# Patient Record
Sex: Female | Born: 1955
Health system: Southern US, Community
[De-identification: ages and names within clinical notes are randomized; demographics above are authoritative.]

## PROBLEM LIST (undated history)

## (undated) DIAGNOSIS — H469 Unspecified optic neuritis: Secondary | ICD-10-CM

## (undated) DIAGNOSIS — E162 Hypoglycemia, unspecified: Secondary | ICD-10-CM

## (undated) DIAGNOSIS — H409 Unspecified glaucoma: Secondary | ICD-10-CM

## (undated) HISTORY — PX: TONSILLECTOMY: SUR1361

## (undated) HISTORY — PX: ABDOMINAL HYSTERECTOMY: SHX81

## (undated) HISTORY — PX: EYE SURGERY: SHX253

## (undated) HISTORY — DX: Unspecified optic neuritis: H46.9

## (undated) HISTORY — PX: TUBAL LIGATION: SHX77

---

## 1997-08-09 ENCOUNTER — Ambulatory Visit (HOSPITAL_COMMUNITY): Admission: RE | Admit: 1997-08-09 | Discharge: 1997-08-09 | Payer: Self-pay | Admitting: Obstetrics & Gynecology

## 1997-09-24 ENCOUNTER — Encounter: Admission: RE | Admit: 1997-09-24 | Discharge: 1997-09-24 | Payer: Self-pay | Admitting: Obstetrics & Gynecology

## 1997-10-14 ENCOUNTER — Inpatient Hospital Stay (HOSPITAL_COMMUNITY): Admission: AD | Admit: 1997-10-14 | Discharge: 1997-10-14 | Payer: Self-pay | Admitting: Obstetrics & Gynecology

## 2000-05-14 HISTORY — PX: SMALL INTESTINE SURGERY: SHX150

## 2010-05-11 ENCOUNTER — Ambulatory Visit (HOSPITAL_COMMUNITY): Payer: Self-pay | Admitting: Licensed Clinical Social Worker

## 2010-06-01 ENCOUNTER — Ambulatory Visit (HOSPITAL_COMMUNITY)
Admission: RE | Admit: 2010-06-01 | Discharge: 2010-06-01 | Payer: Self-pay | Source: Home / Self Care | Attending: Licensed Clinical Social Worker | Admitting: Licensed Clinical Social Worker

## 2010-06-13 ENCOUNTER — Ambulatory Visit (HOSPITAL_COMMUNITY): Admit: 2010-06-13 | Payer: Self-pay | Admitting: Licensed Clinical Social Worker

## 2010-06-20 ENCOUNTER — Encounter (INDEPENDENT_AMBULATORY_CARE_PROVIDER_SITE_OTHER): Payer: Medicare Other | Admitting: Licensed Clinical Social Worker

## 2010-06-20 DIAGNOSIS — F4321 Adjustment disorder with depressed mood: Secondary | ICD-10-CM

## 2010-06-29 ENCOUNTER — Encounter (HOSPITAL_COMMUNITY): Payer: Self-pay | Admitting: Licensed Clinical Social Worker

## 2010-07-06 ENCOUNTER — Encounter (HOSPITAL_COMMUNITY): Payer: Self-pay | Admitting: Licensed Clinical Social Worker

## 2011-01-17 DIAGNOSIS — M545 Low back pain, unspecified: Secondary | ICD-10-CM | POA: Insufficient documentation

## 2011-07-11 DIAGNOSIS — R109 Unspecified abdominal pain: Secondary | ICD-10-CM | POA: Insufficient documentation

## 2013-07-16 ENCOUNTER — Emergency Department (HOSPITAL_COMMUNITY)
Admission: EM | Admit: 2013-07-16 | Discharge: 2013-07-16 | Disposition: A | Discharge status: Home / Self Care | Payer: Medicare Other | Attending: Emergency Medicine | Admitting: Emergency Medicine

## 2013-07-16 ENCOUNTER — Encounter (HOSPITAL_COMMUNITY): Payer: Self-pay | Admitting: Emergency Medicine

## 2013-07-16 DIAGNOSIS — J3489 Other specified disorders of nose and nasal sinuses: Secondary | ICD-10-CM | POA: Insufficient documentation

## 2013-07-16 DIAGNOSIS — H748X9 Other specified disorders of middle ear and mastoid, unspecified ear: Secondary | ICD-10-CM | POA: Insufficient documentation

## 2013-07-16 DIAGNOSIS — K0889 Other specified disorders of teeth and supporting structures: Secondary | ICD-10-CM

## 2013-07-16 DIAGNOSIS — E162 Hypoglycemia, unspecified: Secondary | ICD-10-CM | POA: Insufficient documentation

## 2013-07-16 DIAGNOSIS — R6883 Chills (without fever): Secondary | ICD-10-CM | POA: Insufficient documentation

## 2013-07-16 DIAGNOSIS — L539 Erythematous condition, unspecified: Secondary | ICD-10-CM | POA: Insufficient documentation

## 2013-07-16 DIAGNOSIS — K089 Disorder of teeth and supporting structures, unspecified: Secondary | ICD-10-CM | POA: Insufficient documentation

## 2013-07-16 DIAGNOSIS — H409 Unspecified glaucoma: Secondary | ICD-10-CM | POA: Insufficient documentation

## 2013-07-16 HISTORY — DX: Hypoglycemia, unspecified: E16.2

## 2013-07-16 HISTORY — DX: Unspecified glaucoma: H40.9

## 2013-07-16 MED ORDER — PENICILLIN V POTASSIUM 500 MG PO TABS: 500.0000 mg | ORAL_TABLET | Freq: Four times a day (QID) | ORAL | Status: AC

## 2013-07-16 MED ORDER — HYDROCODONE-ACETAMINOPHEN 5-325 MG PO TABS: 1.0000 | ORAL_TABLET | ORAL | Status: DC | PRN

## 2013-07-16 MED ORDER — HYDROCODONE-ACETAMINOPHEN 5-325 MG PO TABS
1.0000 | ORAL_TABLET | Freq: Once | ORAL | Status: AC
  Administered 2013-07-16: 1 via ORAL
  Filled 2013-07-16: qty 1

## 2013-07-16 NOTE — ED Provider Notes (Signed)
CSN: [id number]     Arrival date & time 07/16/13  1017 History   This chart was scribed for non-physician practitioner Irish EldersKelly Lailee Hoelzel, NP, working with Ward GivensIva L Knapp, MD, by Yevette EdwardsAngela Bracken, ED Scribe. This patient was seen in room WTR7/WTR7 and the patient's care was started at 12:19 PM.  First MD Initiated Contact with Patient 07/16/13 1150     Chief Complaint  Patient presents with  . Dental Pain    The history is provided by the patient. No language interpreter was used.   HPI Comments: Jessica [name] is a 58 y.o. female who presents to the Emergency Department complaining of right-sided, lower dental pain which began two weeks ago and has gradually worsened. She reports two weeks ago the tooth pain was also associated with swelling. She rate the pain as 5/10. The pain is increased with mastication, and the tooth is also sensitive to temperatures. She has experienced chills and congestion. She denies a fever. She had a crown to the affected tooth. The pt was treated for the dental pain by her PCP; she was provided amoxicillin and IBU and Tylenol pain medication as well as Voltaren gel. She does not have dental insurance.   The pt has used Vicodin previously, though she requests a weaker dose.  Past Medical History  Diagnosis Date  . Glaucoma    Past Surgical History  Procedure Laterality Date  . Eye surgery    . Abdominal hysterectomy    . Tonsillectomy     No family history on file. History  Substance Use Topics  . Smoking status: Never Smoker   . Smokeless tobacco: Not on file  . Alcohol Use: No   No OB history provided.  Review of Systems  Constitutional: Positive for chills. Negative for fever.  HENT: Positive for congestion and dental problem.   All other systems reviewed and are negative.    Allergies  Motrin; Sulfa antibiotics; Beta adrenergic blockers; Macrobid; Ultram; Wheat bran; and Sugar-protein-starch  Home Medications   Current Outpatient Rx  Name  Route  Sig   Dispense  Refill  . amoxicillin (AMOXIL) 500 MG capsule   Oral   Take 1 capsule by mouth 3 (three) times daily.         Marland Kitchen. ibuprofen (ADVIL,MOTRIN) 400 MG tablet   Oral   Take 1 tablet by mouth 3 (three) times daily as needed.         . latanoprost (XALATAN) 0.005 % ophthalmic solution   Right Eye   Place 1 drop into the right eye.         . VOLTAREN 1 % GEL   Topical   Apply 2 g topically daily as needed.          Triage Vitals: BP 113/62  Pulse 58  Temp(Src) 98.2 F (36.8 C) (Oral)  Resp 16  SpO2 100%  Physical Exam  Nursing note and vitals reviewed. Constitutional: She is oriented to person, place, and time. She appears well-developed and well-nourished. No distress.  HENT:  Head: Normocephalic and atraumatic.  Right Ear: Tympanic membrane is not erythematous and not retracted. A middle ear effusion is present.  Left Ear: Tympanic membrane normal.  Mouth/Throat:    Eyes: EOM are normal.  Neck: Neck supple. No tracheal deviation present.  Cardiovascular: Normal rate, regular rhythm and normal heart sounds.  Exam reveals no friction rub.   No murmur heard. Pulmonary/Chest: Effort normal and breath sounds normal. No respiratory distress. She has no wheezes. She  has no rales.  Musculoskeletal: Normal range of motion.  Neurological: She is alert and oriented to person, place, and time.  Skin: Skin is warm and dry.  Psychiatric: She has a normal mood and affect. Her behavior is normal.    ED Course  Procedures (including critical care time)  DIAGNOSTIC STUDIES: Oxygen Saturation is 100% on room air, normal by my interpretation.    COORDINATION OF CARE:  12:26 PM- Discussed treatment plan with patient, which includes pain medication and antibiotic, and the patient agreed to the plan. Advised the pt that she needed to follow-up with a dentist and informed pt she would be provided with a list of dental references.   Labs Review Labs Reviewed - No data to  display Imaging Review No results found.   EKG Interpretation None      MDM   Final diagnoses:  Pain, dental   Dental pain with probable abscess. Afebrile. No difficulty swallowing. Mild swelling along jaw line and painful to chew. Hydrocodone given here with relief. Pen VK prescribed with few hydrocodone. Advised follow-up with dentist. Low cost dental information given to pt.   I personally performed the services described in this documentation, which was scribed in my presence. The recorded information has been reviewed and is accurate.    Irish Elders, NP 07/23/13 1422

## 2013-07-16 NOTE — Discharge Instructions (Signed)
Dental Pain A tooth ache may be caused by cavities (tooth decay). Cavities expose the nerve of the tooth to air and hot or cold temperatures. It may come from an infection or abscess (also called a boil or furuncle) around your tooth. It is also often caused by dental caries (tooth decay). This causes the pain you are having. DIAGNOSIS  Your caregiver can diagnose this problem by exam. TREATMENT   If caused by an infection, it may be treated with medications which kill germs (antibiotics) and pain medications as prescribed by your caregiver. Take medications as directed.  Only take over-the-counter or prescription medicines for pain, discomfort, or fever as directed by your caregiver.  Whether the tooth ache today is caused by infection or dental disease, you should see your dentist as soon as possible for further care. SEEK MEDICAL CARE IF: The exam and treatment you received today has been provided on an emergency basis only. This is not a substitute for complete medical or dental care. If your problem worsens or new problems (symptoms) appear, and you are unable to meet with your dentist, call or return to this location. SEEK IMMEDIATE MEDICAL CARE IF:   You have a fever.  You develop redness and swelling of your face, jaw, or neck.  You are unable to open your mouth.  You have severe pain uncontrolled by pain medicine. MAKE SURE YOU:   Understand these instructions.  Will watch your condition.  Will get help right away if you are not doing well or get worse. Document Released: 04/30/2005 Document Revised: 07/23/2011 Document Reviewed: 12/17/2007 [facility] Patient Information 2014 [employer] [place].   Take antibiotic prescription as directed Norco for mod-severe pain Follow-up with dentist

## 2013-07-16 NOTE — ED Notes (Signed)
Per pt, states dental pain for 2 weeks-bottom right tooth-was given antibiotic and pain meds from PCP-pain is coming back-

## 2013-07-17 ENCOUNTER — Telehealth (HOSPITAL_COMMUNITY): Payer: Self-pay

## 2013-07-17 NOTE — ED Notes (Signed)
Call from on call dentist Dr Cain SieveJohn Knox requesting demographics and referral be faxed to their office.

## 2013-07-23 NOTE — ED Provider Notes (Signed)
Medical screening examination/treatment/procedure(s) were performed by non-physician practitioner and as supervising physician I was immediately available for consultation/collaboration.   EKG Interpretation None      Lorenza Winkleman, MD, FACEP   Amaurie Wandel L Nasif Bos, MD 07/23/13 1456 

## 2013-12-12 ENCOUNTER — Encounter (HOSPITAL_COMMUNITY): Payer: Self-pay | Admitting: Emergency Medicine

## 2013-12-12 ENCOUNTER — Emergency Department (HOSPITAL_COMMUNITY)
Admission: EM | Admit: 2013-12-12 | Discharge: 2013-12-12 | Disposition: A | Discharge status: Home / Self Care | Payer: No Typology Code available for payment source | Attending: Emergency Medicine | Admitting: Emergency Medicine

## 2013-12-12 ENCOUNTER — Emergency Department (HOSPITAL_COMMUNITY): Payer: No Typology Code available for payment source

## 2013-12-12 DIAGNOSIS — Z862 Personal history of diseases of the blood and blood-forming organs and certain disorders involving the immune mechanism: Secondary | ICD-10-CM | POA: Diagnosis not present

## 2013-12-12 DIAGNOSIS — Y92410 Unspecified street and highway as the place of occurrence of the external cause: Secondary | ICD-10-CM | POA: Insufficient documentation

## 2013-12-12 DIAGNOSIS — Z8639 Personal history of other endocrine, nutritional and metabolic disease: Secondary | ICD-10-CM | POA: Insufficient documentation

## 2013-12-12 DIAGNOSIS — S8010XA Contusion of unspecified lower leg, initial encounter: Secondary | ICD-10-CM | POA: Insufficient documentation

## 2013-12-12 DIAGNOSIS — S5000XA Contusion of unspecified elbow, initial encounter: Secondary | ICD-10-CM | POA: Insufficient documentation

## 2013-12-12 DIAGNOSIS — IMO0002 Reserved for concepts with insufficient information to code with codable children: Secondary | ICD-10-CM | POA: Insufficient documentation

## 2013-12-12 DIAGNOSIS — S5001XA Contusion of right elbow, initial encounter: Secondary | ICD-10-CM

## 2013-12-12 DIAGNOSIS — S161XXA Strain of muscle, fascia and tendon at neck level, initial encounter: Secondary | ICD-10-CM

## 2013-12-12 DIAGNOSIS — Y9389 Activity, other specified: Secondary | ICD-10-CM | POA: Insufficient documentation

## 2013-12-12 DIAGNOSIS — Z79899 Other long term (current) drug therapy: Secondary | ICD-10-CM | POA: Insufficient documentation

## 2013-12-12 DIAGNOSIS — S8011XA Contusion of right lower leg, initial encounter: Secondary | ICD-10-CM

## 2013-12-12 DIAGNOSIS — Z791 Long term (current) use of non-steroidal anti-inflammatories (NSAID): Secondary | ICD-10-CM | POA: Insufficient documentation

## 2013-12-12 DIAGNOSIS — Z792 Long term (current) use of antibiotics: Secondary | ICD-10-CM | POA: Diagnosis not present

## 2013-12-12 DIAGNOSIS — Z043 Encounter for examination and observation following other accident: Secondary | ICD-10-CM | POA: Insufficient documentation

## 2013-12-12 DIAGNOSIS — S139XXA Sprain of joints and ligaments of unspecified parts of neck, initial encounter: Secondary | ICD-10-CM | POA: Diagnosis not present

## 2013-12-12 MED ORDER — CYCLOBENZAPRINE HCL 10 MG PO TABS: 10.0000 mg | ORAL_TABLET | Freq: Every day | ORAL | Status: DC

## 2013-12-12 MED ORDER — MORPHINE SULFATE 4 MG/ML IJ SOLN
4.0000 mg | Freq: Once | INTRAMUSCULAR | Status: AC
  Administered 2013-12-12: 4 mg via INTRAVENOUS
  Filled 2013-12-12: qty 1

## 2013-12-12 MED ORDER — NAPROXEN 250 MG PO TABS
500.0000 mg | ORAL_TABLET | Freq: Once | ORAL | Status: AC
  Administered 2013-12-12: 500 mg via ORAL
  Filled 2013-12-12: qty 2

## 2013-12-12 MED ORDER — NAPROXEN 500 MG PO TABS: 500.0000 mg | ORAL_TABLET | Freq: Two times a day (BID) | ORAL | Status: DC

## 2013-12-12 MED ORDER — HYDROCODONE-ACETAMINOPHEN 5-325 MG PO TABS: 1.0000 | ORAL_TABLET | ORAL | Status: DC | PRN
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
## 2013-12-12 NOTE — Discharge Instructions (Signed)
Call for a follow up appointment with a Family or Primary Care Provider.  Return if Symptoms worsen.   Take medication as prescribed.  Do not operate heavy machinery while taking flexeril or narcotic pain medication.  Ice your neck, elbow, and leg 3-4 times a day.

## 2013-12-12 NOTE — ED Notes (Signed)
Patient transported to X-ray 

## 2013-12-12 NOTE — ED Notes (Signed)
Patient returned from X-ray 

## 2013-12-17 NOTE — ED Provider Notes (Signed)
Medical screening examination/treatment/procedure(s) were performed by non-physician practitioner and as supervising physician I was immediately available for consultation/collaboration.   EKG Interpretation None        Roseanna Koplin, MD 12/17/13 0728 

## 2013-12-31 ENCOUNTER — Emergency Department (HOSPITAL_COMMUNITY)
Admission: EM | Admit: 2013-12-31 | Discharge: 2014-01-01 | Disposition: A | Discharge status: Home / Self Care | Payer: No Typology Code available for payment source | Attending: Emergency Medicine | Admitting: Emergency Medicine

## 2013-12-31 DIAGNOSIS — G8911 Acute pain due to trauma: Secondary | ICD-10-CM | POA: Diagnosis not present

## 2013-12-31 DIAGNOSIS — Z8639 Personal history of other endocrine, nutritional and metabolic disease: Secondary | ICD-10-CM | POA: Insufficient documentation

## 2013-12-31 DIAGNOSIS — M79609 Pain in unspecified limb: Secondary | ICD-10-CM | POA: Diagnosis not present

## 2013-12-31 DIAGNOSIS — S46819A Strain of other muscles, fascia and tendons at shoulder and upper arm level, unspecified arm, initial encounter: Principal | ICD-10-CM

## 2013-12-31 DIAGNOSIS — S43499A Other sprain of unspecified shoulder joint, initial encounter: Secondary | ICD-10-CM | POA: Insufficient documentation

## 2013-12-31 DIAGNOSIS — Z791 Long term (current) use of non-steroidal anti-inflammatories (NSAID): Secondary | ICD-10-CM | POA: Diagnosis not present

## 2013-12-31 DIAGNOSIS — Y9389 Activity, other specified: Secondary | ICD-10-CM | POA: Insufficient documentation

## 2013-12-31 DIAGNOSIS — Y9289 Other specified places as the place of occurrence of the external cause: Secondary | ICD-10-CM | POA: Insufficient documentation

## 2013-12-31 DIAGNOSIS — Z79899 Other long term (current) drug therapy: Secondary | ICD-10-CM | POA: Diagnosis not present

## 2013-12-31 DIAGNOSIS — H409 Unspecified glaucoma: Secondary | ICD-10-CM | POA: Diagnosis not present

## 2013-12-31 DIAGNOSIS — R51 Headache: Secondary | ICD-10-CM | POA: Insufficient documentation

## 2013-12-31 DIAGNOSIS — Z862 Personal history of diseases of the blood and blood-forming organs and certain disorders involving the immune mechanism: Secondary | ICD-10-CM | POA: Diagnosis not present

## 2013-12-31 DIAGNOSIS — M542 Cervicalgia: Secondary | ICD-10-CM | POA: Insufficient documentation

## 2013-12-31 DIAGNOSIS — S46909A Unspecified injury of unspecified muscle, fascia and tendon at shoulder and upper arm level, unspecified arm, initial encounter: Secondary | ICD-10-CM | POA: Insufficient documentation

## 2013-12-31 DIAGNOSIS — X500XXA Overexertion from strenuous movement or load, initial encounter: Secondary | ICD-10-CM | POA: Diagnosis not present

## 2013-12-31 DIAGNOSIS — S4980XA Other specified injuries of shoulder and upper arm, unspecified arm, initial encounter: Secondary | ICD-10-CM | POA: Diagnosis present

## 2013-12-31 DIAGNOSIS — H539 Unspecified visual disturbance: Secondary | ICD-10-CM | POA: Insufficient documentation

## 2013-12-31 DIAGNOSIS — S46811A Strain of other muscles, fascia and tendons at shoulder and upper arm level, right arm, initial encounter: Secondary | ICD-10-CM
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
## 2014-01-01 DIAGNOSIS — S43499A Other sprain of unspecified shoulder joint, initial encounter: Secondary | ICD-10-CM | POA: Diagnosis not present

## 2014-01-01 MED ORDER — NAPROXEN 375 MG PO TABS: 375.0000 mg | ORAL_TABLET | Freq: Two times a day (BID) | ORAL | Status: DC

## 2014-01-01 MED ORDER — NAPROXEN 250 MG PO TABS
375.0000 mg | ORAL_TABLET | Freq: Once | ORAL | Status: AC
  Administered 2014-01-01: 375 mg via ORAL
  Filled 2014-01-01: qty 2

## 2014-01-01 NOTE — ED Notes (Signed)
[facility]  Was seen here at that time.  C/O continued neck pain and pain that goes across the back of her head.

## 2014-01-01 NOTE — Discharge Instructions (Signed)
If you were given medicines take as directed.  If you are on coumadin or contraceptives realize their levels and effectiveness is altered by many different medicines.  If you have any reaction (rash, tongues swelling, other) to the medicines stop taking and see a physician.   Continue heat as needed, stretching and physical therapy.  Please follow up as directed and return to the ER or see a physician for new or worsening symptoms.  Thank you. Filed Vitals:   12/31/13 2000 01/01/14 0015 01/01/14 0030  BP: 107/71 111/82 106/90  Pulse: 89 56 59  Temp: 98 F (36.7 C)    TempSrc: Oral    Resp: 17 20 16   Height: 5\' 3"  (1.6 m)    Weight: 143 lb (64.864 kg)    SpO2: 99% 99% 100%

## 2014-01-01 NOTE — ED Notes (Signed)
MD at bedside. 
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
## 2014-02-25 ENCOUNTER — Other Ambulatory Visit: Payer: Self-pay | Admitting: Family Medicine

## 2014-02-25 DIAGNOSIS — R1013 Epigastric pain: Secondary | ICD-10-CM

## 2014-03-05 ENCOUNTER — Ambulatory Visit
Admission: RE | Admit: 2014-03-05 | Discharge: 2014-03-05 | Disposition: A | Discharge status: Home / Self Care | Payer: Medicare HMO | Source: Ambulatory Visit | Attending: Family Medicine | Admitting: Family Medicine

## 2014-03-05 DIAGNOSIS — R1013 Epigastric pain: Secondary | ICD-10-CM

## 2014-08-18 ENCOUNTER — Other Ambulatory Visit: Payer: Self-pay | Admitting: Family Medicine

## 2014-08-18 DIAGNOSIS — R1013 Epigastric pain: Secondary | ICD-10-CM

## 2014-08-23 ENCOUNTER — Ambulatory Visit
Admission: RE | Admit: 2014-08-23 | Discharge: 2014-08-23 | Disposition: A | Discharge status: Home / Self Care | Payer: Medicare HMO | Source: Ambulatory Visit | Attending: Family Medicine | Admitting: Family Medicine

## 2014-08-23 DIAGNOSIS — R1013 Epigastric pain: Secondary | ICD-10-CM

## 2014-10-28 DIAGNOSIS — H5010 Unspecified exotropia: Secondary | ICD-10-CM | POA: Insufficient documentation

## 2014-10-28 DIAGNOSIS — H1820 Unspecified corneal edema: Secondary | ICD-10-CM | POA: Insufficient documentation

## 2015-02-18 DIAGNOSIS — M62830 Muscle spasm of back: Secondary | ICD-10-CM | POA: Diagnosis not present

## 2015-02-18 DIAGNOSIS — M9901 Segmental and somatic dysfunction of cervical region: Secondary | ICD-10-CM | POA: Diagnosis not present

## 2015-02-18 DIAGNOSIS — M9903 Segmental and somatic dysfunction of lumbar region: Secondary | ICD-10-CM | POA: Diagnosis not present

## 2015-02-21 DIAGNOSIS — M62830 Muscle spasm of back: Secondary | ICD-10-CM | POA: Diagnosis not present

## 2015-02-21 DIAGNOSIS — M9901 Segmental and somatic dysfunction of cervical region: Secondary | ICD-10-CM | POA: Diagnosis not present

## 2015-02-21 DIAGNOSIS — M9903 Segmental and somatic dysfunction of lumbar region: Secondary | ICD-10-CM | POA: Diagnosis not present

## 2015-02-25 DIAGNOSIS — M9903 Segmental and somatic dysfunction of lumbar region: Secondary | ICD-10-CM | POA: Diagnosis not present

## 2015-02-25 DIAGNOSIS — M9901 Segmental and somatic dysfunction of cervical region: Secondary | ICD-10-CM | POA: Diagnosis not present

## 2015-02-25 DIAGNOSIS — M62830 Muscle spasm of back: Secondary | ICD-10-CM | POA: Diagnosis not present

## 2015-02-28 DIAGNOSIS — M9901 Segmental and somatic dysfunction of cervical region: Secondary | ICD-10-CM | POA: Diagnosis not present

## 2015-02-28 DIAGNOSIS — M62830 Muscle spasm of back: Secondary | ICD-10-CM | POA: Diagnosis not present

## 2015-02-28 DIAGNOSIS — M9903 Segmental and somatic dysfunction of lumbar region: Secondary | ICD-10-CM | POA: Diagnosis not present

## 2015-03-15 DIAGNOSIS — N952 Postmenopausal atrophic vaginitis: Secondary | ICD-10-CM | POA: Diagnosis not present

## 2015-03-15 DIAGNOSIS — Z0000 Encounter for general adult medical examination without abnormal findings: Secondary | ICD-10-CM | POA: Diagnosis not present

## 2015-03-15 DIAGNOSIS — B373 Candidiasis of vulva and vagina: Secondary | ICD-10-CM | POA: Diagnosis not present

## 2015-03-15 DIAGNOSIS — J309 Allergic rhinitis, unspecified: Secondary | ICD-10-CM | POA: Diagnosis not present

## 2015-03-15 DIAGNOSIS — Z1211 Encounter for screening for malignant neoplasm of colon: Secondary | ICD-10-CM | POA: Diagnosis not present

## 2015-03-15 DIAGNOSIS — Z136 Encounter for screening for cardiovascular disorders: Secondary | ICD-10-CM | POA: Diagnosis not present

## 2015-03-15 DIAGNOSIS — Z01419 Encounter for gynecological examination (general) (routine) without abnormal findings: Secondary | ICD-10-CM | POA: Diagnosis not present

## 2015-03-28 DIAGNOSIS — M9903 Segmental and somatic dysfunction of lumbar region: Secondary | ICD-10-CM | POA: Diagnosis not present

## 2015-03-28 DIAGNOSIS — M9901 Segmental and somatic dysfunction of cervical region: Secondary | ICD-10-CM | POA: Diagnosis not present

## 2015-03-28 DIAGNOSIS — M62830 Muscle spasm of back: Secondary | ICD-10-CM | POA: Diagnosis not present

## 2015-03-30 DIAGNOSIS — M9901 Segmental and somatic dysfunction of cervical region: Secondary | ICD-10-CM | POA: Diagnosis not present

## 2015-03-30 DIAGNOSIS — M62830 Muscle spasm of back: Secondary | ICD-10-CM | POA: Diagnosis not present

## 2015-03-30 DIAGNOSIS — M9903 Segmental and somatic dysfunction of lumbar region: Secondary | ICD-10-CM | POA: Diagnosis not present

## 2015-04-04 DIAGNOSIS — M9903 Segmental and somatic dysfunction of lumbar region: Secondary | ICD-10-CM | POA: Diagnosis not present

## 2015-04-04 DIAGNOSIS — M62830 Muscle spasm of back: Secondary | ICD-10-CM | POA: Diagnosis not present

## 2015-04-04 DIAGNOSIS — M9901 Segmental and somatic dysfunction of cervical region: Secondary | ICD-10-CM | POA: Diagnosis not present

## 2015-04-05 DIAGNOSIS — Z1231 Encounter for screening mammogram for malignant neoplasm of breast: Secondary | ICD-10-CM | POA: Diagnosis not present

## 2015-04-05 DIAGNOSIS — M9901 Segmental and somatic dysfunction of cervical region: Secondary | ICD-10-CM | POA: Diagnosis not present

## 2015-04-05 DIAGNOSIS — M9903 Segmental and somatic dysfunction of lumbar region: Secondary | ICD-10-CM | POA: Diagnosis not present

## 2015-04-05 DIAGNOSIS — M62830 Muscle spasm of back: Secondary | ICD-10-CM | POA: Diagnosis not present

## 2015-04-11 DIAGNOSIS — M62830 Muscle spasm of back: Secondary | ICD-10-CM | POA: Diagnosis not present

## 2015-04-11 DIAGNOSIS — M9901 Segmental and somatic dysfunction of cervical region: Secondary | ICD-10-CM | POA: Diagnosis not present

## 2015-04-11 DIAGNOSIS — M9903 Segmental and somatic dysfunction of lumbar region: Secondary | ICD-10-CM | POA: Diagnosis not present

## 2015-05-27 DIAGNOSIS — M9903 Segmental and somatic dysfunction of lumbar region: Secondary | ICD-10-CM | POA: Diagnosis not present

## 2015-05-27 DIAGNOSIS — M9901 Segmental and somatic dysfunction of cervical region: Secondary | ICD-10-CM | POA: Diagnosis not present

## 2015-05-27 DIAGNOSIS — M62830 Muscle spasm of back: Secondary | ICD-10-CM | POA: Diagnosis not present

## 2015-05-30 DIAGNOSIS — M62830 Muscle spasm of back: Secondary | ICD-10-CM | POA: Diagnosis not present

## 2015-05-30 DIAGNOSIS — M9901 Segmental and somatic dysfunction of cervical region: Secondary | ICD-10-CM | POA: Diagnosis not present

## 2015-05-30 DIAGNOSIS — M9903 Segmental and somatic dysfunction of lumbar region: Secondary | ICD-10-CM | POA: Diagnosis not present

## 2015-06-02 DIAGNOSIS — M9903 Segmental and somatic dysfunction of lumbar region: Secondary | ICD-10-CM | POA: Diagnosis not present

## 2015-06-02 DIAGNOSIS — M9901 Segmental and somatic dysfunction of cervical region: Secondary | ICD-10-CM | POA: Diagnosis not present

## 2015-06-02 DIAGNOSIS — M62830 Muscle spasm of back: Secondary | ICD-10-CM | POA: Diagnosis not present

## 2015-06-06 DIAGNOSIS — M9901 Segmental and somatic dysfunction of cervical region: Secondary | ICD-10-CM | POA: Diagnosis not present

## 2015-06-06 DIAGNOSIS — M9903 Segmental and somatic dysfunction of lumbar region: Secondary | ICD-10-CM | POA: Diagnosis not present

## 2015-06-06 DIAGNOSIS — M62830 Muscle spasm of back: Secondary | ICD-10-CM | POA: Diagnosis not present

## 2015-06-15 DIAGNOSIS — M9903 Segmental and somatic dysfunction of lumbar region: Secondary | ICD-10-CM | POA: Diagnosis not present

## 2015-06-15 DIAGNOSIS — M62830 Muscle spasm of back: Secondary | ICD-10-CM | POA: Diagnosis not present

## 2015-06-15 DIAGNOSIS — M9901 Segmental and somatic dysfunction of cervical region: Secondary | ICD-10-CM | POA: Diagnosis not present

## 2015-06-17 DIAGNOSIS — M9903 Segmental and somatic dysfunction of lumbar region: Secondary | ICD-10-CM | POA: Diagnosis not present

## 2015-06-17 DIAGNOSIS — M62830 Muscle spasm of back: Secondary | ICD-10-CM | POA: Diagnosis not present

## 2015-06-17 DIAGNOSIS — M9901 Segmental and somatic dysfunction of cervical region: Secondary | ICD-10-CM | POA: Diagnosis not present

## 2015-06-20 DIAGNOSIS — M9901 Segmental and somatic dysfunction of cervical region: Secondary | ICD-10-CM | POA: Diagnosis not present

## 2015-06-20 DIAGNOSIS — M62830 Muscle spasm of back: Secondary | ICD-10-CM | POA: Diagnosis not present

## 2015-06-20 DIAGNOSIS — M9903 Segmental and somatic dysfunction of lumbar region: Secondary | ICD-10-CM | POA: Diagnosis not present

## 2015-06-21 DIAGNOSIS — E559 Vitamin D deficiency, unspecified: Secondary | ICD-10-CM | POA: Diagnosis not present

## 2015-06-23 DIAGNOSIS — K219 Gastro-esophageal reflux disease without esophagitis: Secondary | ICD-10-CM | POA: Diagnosis not present

## 2015-06-23 DIAGNOSIS — R1013 Epigastric pain: Secondary | ICD-10-CM | POA: Diagnosis not present

## 2015-06-23 DIAGNOSIS — E559 Vitamin D deficiency, unspecified: Secondary | ICD-10-CM | POA: Diagnosis not present

## 2015-06-23 DIAGNOSIS — R300 Dysuria: Secondary | ICD-10-CM | POA: Diagnosis not present

## 2015-08-10 DIAGNOSIS — K5900 Constipation, unspecified: Secondary | ICD-10-CM | POA: Diagnosis not present

## 2015-08-10 DIAGNOSIS — K219 Gastro-esophageal reflux disease without esophagitis: Secondary | ICD-10-CM | POA: Diagnosis not present

## 2015-09-27 DIAGNOSIS — S0086XA Insect bite (nonvenomous) of other part of head, initial encounter: Secondary | ICD-10-CM | POA: Diagnosis not present

## 2015-09-27 DIAGNOSIS — S30860A Insect bite (nonvenomous) of lower back and pelvis, initial encounter: Secondary | ICD-10-CM | POA: Diagnosis not present

## 2015-09-27 DIAGNOSIS — G629 Polyneuropathy, unspecified: Secondary | ICD-10-CM | POA: Diagnosis not present

## 2015-09-27 DIAGNOSIS — S1096XA Insect bite of unspecified part of neck, initial encounter: Secondary | ICD-10-CM | POA: Diagnosis not present

## 2015-09-27 DIAGNOSIS — Z885 Allergy status to narcotic agent status: Secondary | ICD-10-CM | POA: Diagnosis not present

## 2015-09-27 DIAGNOSIS — Z888 Allergy status to other drugs, medicaments and biological substances status: Secondary | ICD-10-CM | POA: Diagnosis not present

## 2015-09-27 DIAGNOSIS — S50862A Insect bite (nonvenomous) of left forearm, initial encounter: Secondary | ICD-10-CM | POA: Diagnosis not present

## 2015-09-27 DIAGNOSIS — Z886 Allergy status to analgesic agent status: Secondary | ICD-10-CM | POA: Diagnosis not present

## 2015-09-27 DIAGNOSIS — Z882 Allergy status to sulfonamides status: Secondary | ICD-10-CM | POA: Diagnosis not present

## 2015-09-27 DIAGNOSIS — W57XXXA Bitten or stung by nonvenomous insect and other nonvenomous arthropods, initial encounter: Secondary | ICD-10-CM | POA: Diagnosis not present

## 2015-10-20 DIAGNOSIS — H9203 Otalgia, bilateral: Secondary | ICD-10-CM | POA: Diagnosis not present

## 2015-10-20 DIAGNOSIS — J309 Allergic rhinitis, unspecified: Secondary | ICD-10-CM | POA: Diagnosis not present

## 2015-10-20 DIAGNOSIS — H6123 Impacted cerumen, bilateral: Secondary | ICD-10-CM | POA: Diagnosis not present

## 2015-10-21 DIAGNOSIS — H401114 Primary open-angle glaucoma, right eye, indeterminate stage: Secondary | ICD-10-CM | POA: Diagnosis not present

## 2015-10-21 DIAGNOSIS — H1820 Unspecified corneal edema: Secondary | ICD-10-CM | POA: Diagnosis not present

## 2015-10-21 DIAGNOSIS — H43811 Vitreous degeneration, right eye: Secondary | ICD-10-CM | POA: Diagnosis not present

## 2015-10-21 DIAGNOSIS — H5010 Unspecified exotropia: Secondary | ICD-10-CM | POA: Diagnosis not present

## 2015-12-02 DIAGNOSIS — W228XXA Striking against or struck by other objects, initial encounter: Secondary | ICD-10-CM | POA: Diagnosis not present

## 2015-12-02 DIAGNOSIS — T148 Other injury of unspecified body region: Secondary | ICD-10-CM | POA: Diagnosis not present

## 2015-12-07 DIAGNOSIS — W228XXA Striking against or struck by other objects, initial encounter: Secondary | ICD-10-CM | POA: Diagnosis not present

## 2015-12-07 DIAGNOSIS — S9032XA Contusion of left foot, initial encounter: Secondary | ICD-10-CM | POA: Diagnosis not present

## 2015-12-29 DIAGNOSIS — M62830 Muscle spasm of back: Secondary | ICD-10-CM | POA: Diagnosis not present

## 2015-12-29 DIAGNOSIS — M9901 Segmental and somatic dysfunction of cervical region: Secondary | ICD-10-CM | POA: Diagnosis not present

## 2015-12-29 DIAGNOSIS — M9903 Segmental and somatic dysfunction of lumbar region: Secondary | ICD-10-CM | POA: Diagnosis not present

## 2016-01-04 DIAGNOSIS — M9903 Segmental and somatic dysfunction of lumbar region: Secondary | ICD-10-CM | POA: Diagnosis not present

## 2016-01-04 DIAGNOSIS — M9901 Segmental and somatic dysfunction of cervical region: Secondary | ICD-10-CM | POA: Diagnosis not present

## 2016-01-04 DIAGNOSIS — M62830 Muscle spasm of back: Secondary | ICD-10-CM | POA: Diagnosis not present

## 2016-01-06 DIAGNOSIS — M9901 Segmental and somatic dysfunction of cervical region: Secondary | ICD-10-CM | POA: Diagnosis not present

## 2016-01-06 DIAGNOSIS — M62830 Muscle spasm of back: Secondary | ICD-10-CM | POA: Diagnosis not present

## 2016-01-06 DIAGNOSIS — M9903 Segmental and somatic dysfunction of lumbar region: Secondary | ICD-10-CM | POA: Diagnosis not present

## 2016-01-11 DIAGNOSIS — M9903 Segmental and somatic dysfunction of lumbar region: Secondary | ICD-10-CM | POA: Diagnosis not present

## 2016-01-11 DIAGNOSIS — M9901 Segmental and somatic dysfunction of cervical region: Secondary | ICD-10-CM | POA: Diagnosis not present

## 2016-01-11 DIAGNOSIS — M62830 Muscle spasm of back: Secondary | ICD-10-CM | POA: Diagnosis not present

## 2016-01-13 DIAGNOSIS — M9903 Segmental and somatic dysfunction of lumbar region: Secondary | ICD-10-CM | POA: Diagnosis not present

## 2016-01-13 DIAGNOSIS — M9901 Segmental and somatic dysfunction of cervical region: Secondary | ICD-10-CM | POA: Diagnosis not present

## 2016-01-13 DIAGNOSIS — M62830 Muscle spasm of back: Secondary | ICD-10-CM | POA: Diagnosis not present

## 2016-01-20 DIAGNOSIS — M9901 Segmental and somatic dysfunction of cervical region: Secondary | ICD-10-CM | POA: Diagnosis not present

## 2016-01-20 DIAGNOSIS — M9903 Segmental and somatic dysfunction of lumbar region: Secondary | ICD-10-CM | POA: Diagnosis not present

## 2016-01-20 DIAGNOSIS — M62830 Muscle spasm of back: Secondary | ICD-10-CM | POA: Diagnosis not present

## 2016-02-07 ENCOUNTER — Other Ambulatory Visit: Payer: Self-pay | Admitting: Family Medicine

## 2016-02-07 DIAGNOSIS — R1013 Epigastric pain: Secondary | ICD-10-CM

## 2016-02-07 DIAGNOSIS — N390 Urinary tract infection, site not specified: Secondary | ICD-10-CM | POA: Diagnosis not present

## 2016-02-07 DIAGNOSIS — K5900 Constipation, unspecified: Secondary | ICD-10-CM | POA: Diagnosis not present

## 2016-02-07 DIAGNOSIS — A047 Enterocolitis due to Clostridium difficile: Secondary | ICD-10-CM | POA: Diagnosis not present

## 2016-02-07 DIAGNOSIS — R1011 Right upper quadrant pain: Secondary | ICD-10-CM

## 2016-02-07 DIAGNOSIS — R102 Pelvic and perineal pain: Secondary | ICD-10-CM | POA: Diagnosis not present

## 2016-02-07 DIAGNOSIS — K219 Gastro-esophageal reflux disease without esophagitis: Secondary | ICD-10-CM | POA: Diagnosis not present

## 2016-02-10 ENCOUNTER — Ambulatory Visit
Admission: RE | Admit: 2016-02-10 | Discharge: 2016-02-10 | Disposition: A | Discharge status: Home / Self Care | Payer: Medicare HMO | Source: Ambulatory Visit | Attending: Family Medicine | Admitting: Family Medicine

## 2016-02-10 ENCOUNTER — Other Ambulatory Visit: Payer: Self-pay | Admitting: Family Medicine

## 2016-02-10 DIAGNOSIS — R1084 Generalized abdominal pain: Secondary | ICD-10-CM

## 2016-02-10 DIAGNOSIS — R1013 Epigastric pain: Secondary | ICD-10-CM

## 2016-02-10 DIAGNOSIS — R1011 Right upper quadrant pain: Secondary | ICD-10-CM | POA: Diagnosis not present

## 2016-02-10 DIAGNOSIS — R1032 Left lower quadrant pain: Secondary | ICD-10-CM | POA: Diagnosis not present

## 2016-02-24 ENCOUNTER — Other Ambulatory Visit: Payer: Self-pay | Admitting: Family Medicine

## 2016-02-24 DIAGNOSIS — R1906 Epigastric swelling, mass or lump: Secondary | ICD-10-CM | POA: Diagnosis not present

## 2016-02-24 DIAGNOSIS — K219 Gastro-esophageal reflux disease without esophagitis: Secondary | ICD-10-CM | POA: Diagnosis not present

## 2016-02-24 DIAGNOSIS — K5900 Constipation, unspecified: Secondary | ICD-10-CM | POA: Diagnosis not present

## 2016-02-29 ENCOUNTER — Other Ambulatory Visit: Payer: Medicare HMO

## 2016-03-06 ENCOUNTER — Ambulatory Visit
Admission: RE | Admit: 2016-03-06 | Discharge: 2016-03-06 | Disposition: A | Discharge status: Home / Self Care | Payer: Medicare HMO | Source: Ambulatory Visit | Attending: Family Medicine | Admitting: Family Medicine

## 2016-03-06 DIAGNOSIS — K439 Ventral hernia without obstruction or gangrene: Secondary | ICD-10-CM | POA: Diagnosis not present

## 2016-03-06 DIAGNOSIS — R1906 Epigastric swelling, mass or lump: Secondary | ICD-10-CM

## 2016-03-07 DIAGNOSIS — K219 Gastro-esophageal reflux disease without esophagitis: Secondary | ICD-10-CM | POA: Diagnosis not present

## 2016-03-07 DIAGNOSIS — Z6830 Body mass index (BMI) 30.0-30.9, adult: Secondary | ICD-10-CM | POA: Diagnosis not present

## 2016-03-07 DIAGNOSIS — K439 Ventral hernia without obstruction or gangrene: Secondary | ICD-10-CM | POA: Diagnosis not present

## 2016-03-07 DIAGNOSIS — K5900 Constipation, unspecified: Secondary | ICD-10-CM | POA: Diagnosis not present

## 2016-03-13 DIAGNOSIS — Z0000 Encounter for general adult medical examination without abnormal findings: Secondary | ICD-10-CM | POA: Diagnosis not present

## 2016-03-13 DIAGNOSIS — Z136 Encounter for screening for cardiovascular disorders: Secondary | ICD-10-CM | POA: Diagnosis not present

## 2016-03-15 ENCOUNTER — Other Ambulatory Visit: Payer: Self-pay | Admitting: Family Medicine

## 2016-03-15 DIAGNOSIS — Z1211 Encounter for screening for malignant neoplasm of colon: Secondary | ICD-10-CM | POA: Diagnosis not present

## 2016-03-15 DIAGNOSIS — Z124 Encounter for screening for malignant neoplasm of cervix: Secondary | ICD-10-CM | POA: Diagnosis not present

## 2016-03-15 DIAGNOSIS — Z01419 Encounter for gynecological examination (general) (routine) without abnormal findings: Secondary | ICD-10-CM | POA: Diagnosis not present

## 2016-03-15 DIAGNOSIS — Z6830 Body mass index (BMI) 30.0-30.9, adult: Secondary | ICD-10-CM | POA: Diagnosis not present

## 2016-03-15 DIAGNOSIS — Z0000 Encounter for general adult medical examination without abnormal findings: Secondary | ICD-10-CM | POA: Diagnosis not present

## 2016-04-16 DIAGNOSIS — Z6831 Body mass index (BMI) 31.0-31.9, adult: Secondary | ICD-10-CM | POA: Diagnosis not present

## 2016-04-16 DIAGNOSIS — R350 Frequency of micturition: Secondary | ICD-10-CM | POA: Diagnosis not present

## 2016-04-16 DIAGNOSIS — N390 Urinary tract infection, site not specified: Secondary | ICD-10-CM | POA: Diagnosis not present

## 2016-04-16 DIAGNOSIS — R309 Painful micturition, unspecified: Secondary | ICD-10-CM | POA: Diagnosis not present

## 2016-05-10 DIAGNOSIS — H5211 Myopia, right eye: Secondary | ICD-10-CM | POA: Diagnosis not present

## 2016-05-10 DIAGNOSIS — H40032 Anatomical narrow angle, left eye: Secondary | ICD-10-CM | POA: Diagnosis not present

## 2016-05-10 DIAGNOSIS — H40031 Anatomical narrow angle, right eye: Secondary | ICD-10-CM | POA: Diagnosis not present

## 2016-05-15 DIAGNOSIS — Z1231 Encounter for screening mammogram for malignant neoplasm of breast: Secondary | ICD-10-CM | POA: Diagnosis not present

## 2016-07-09 DIAGNOSIS — H401114 Primary open-angle glaucoma, right eye, indeterminate stage: Secondary | ICD-10-CM | POA: Diagnosis not present

## 2016-07-09 DIAGNOSIS — Z961 Presence of intraocular lens: Secondary | ICD-10-CM | POA: Diagnosis not present

## 2016-07-09 DIAGNOSIS — H44512 Absolute glaucoma, left eye: Secondary | ICD-10-CM | POA: Diagnosis not present

## 2016-07-10 DIAGNOSIS — H401114 Primary open-angle glaucoma, right eye, indeterminate stage: Secondary | ICD-10-CM | POA: Diagnosis not present

## 2016-08-21 ENCOUNTER — Encounter (HOSPITAL_COMMUNITY): Payer: Self-pay

## 2016-08-21 ENCOUNTER — Emergency Department (HOSPITAL_COMMUNITY): Payer: Medicare HMO

## 2016-08-21 ENCOUNTER — Emergency Department (HOSPITAL_COMMUNITY)
Admission: EM | Admit: 2016-08-21 | Discharge: 2016-08-22 | Disposition: A | Discharge status: Home / Self Care | Payer: Medicare HMO | Attending: Emergency Medicine | Admitting: Emergency Medicine

## 2016-08-21 DIAGNOSIS — Z0389 Encounter for observation for other suspected diseases and conditions ruled out: Secondary | ICD-10-CM | POA: Diagnosis not present

## 2016-08-21 DIAGNOSIS — J069 Acute upper respiratory infection, unspecified: Secondary | ICD-10-CM | POA: Insufficient documentation

## 2016-08-21 DIAGNOSIS — R05 Cough: Secondary | ICD-10-CM | POA: Diagnosis not present

## 2016-08-21 LAB — I-STAT TROPONIN, ED: Troponin i, poc: 0.01 ng/mL (ref 0.00–0.08)

## 2016-08-21 NOTE — ED Triage Notes (Signed)
Pt presents with several symptoms. Pt reports chest pain in the center of her chest, dull and heavy in nature. Pt also reports haziness in her vision for the past couple of day. Pt also c/o productive cough as well as generalized weakness.

## 2016-08-22 DIAGNOSIS — J069 Acute upper respiratory infection, unspecified: Secondary | ICD-10-CM | POA: Diagnosis not present

## 2016-08-22 LAB — CBC
HCT: 41 % (ref 36.0–46.0)
Hemoglobin: 14.1 g/dL (ref 12.0–15.0)
MCH: 29.9 pg (ref 26.0–34.0)
MCHC: 34.4 g/dL (ref 30.0–36.0)
MCV: 87 fL (ref 78.0–100.0)
PLATELETS: 176 10*3/uL (ref 150–400)
RBC: 4.71 MIL/uL (ref 3.87–5.11)
RDW: 13.8 % (ref 11.5–15.5)
WBC: 6.5 10*3/uL (ref 4.0–10.5)

## 2016-08-22 LAB — BASIC METABOLIC PANEL
Anion gap: 8 (ref 5–15)
BUN: 9 mg/dL (ref 6–20)
CO2: 24 mmol/L (ref 22–32)
CREATININE: 0.83 mg/dL (ref 0.44–1.00)
Calcium: 9.2 mg/dL (ref 8.9–10.3)
Chloride: 105 mmol/L (ref 101–111)
GFR calc non Af Amer: >60 mL/min (ref >60)
GLUCOSE: 109 mg/dL — AB (ref 65–99)
Potassium: 3.7 mmol/L (ref 3.5–5.1)
Sodium: 137 mmol/L (ref 135–145)

## 2016-08-22 MED ORDER — BENZONATATE 100 MG PO CAPS
100.0000 mg | ORAL_CAPSULE | Freq: Once | ORAL | Status: AC
  Administered 2016-08-22: 100 mg via ORAL
  Filled 2016-08-22: qty 1

## 2016-08-22 MED ORDER — HYDROCODONE-HOMATROPINE 5-1.5 MG/5ML PO SYRP: 3.0000 mL | ORAL_SOLUTION | Freq: Three times a day (TID) | ORAL | 0 refills | Status: DC | PRN

## 2016-08-22 MED ORDER — SODIUM CHLORIDE 0.9 % IV BOLUS (SEPSIS)
1000.0000 mL | Freq: Once | INTRAVENOUS | Status: AC
  Administered 2016-08-22: 1000 mL via INTRAVENOUS

## 2016-08-22 MED ORDER — METHYLPREDNISOLONE SODIUM SUCC 125 MG IJ SOLR
125.0000 mg | Freq: Once | INTRAMUSCULAR | Status: AC
  Administered 2016-08-22: 125 mg via INTRAVENOUS
  Filled 2016-08-22: qty 2

## 2016-08-22 MED ORDER — FLUTICASONE PROPIONATE 50 MCG/ACT NA SUSP
2.0000 | Freq: Every day | NASAL | Status: DC
  Administered 2016-08-22: 2 via NASAL
  Filled 2016-08-22: qty 16

## 2016-08-22 MED ORDER — IPRATROPIUM-ALBUTEROL 0.5-2.5 (3) MG/3ML IN SOLN
3.0000 mL | Freq: Once | RESPIRATORY_TRACT | Status: AC
  Administered 2016-08-22: 3 mL via RESPIRATORY_TRACT
  Filled 2016-08-22: qty 3

## 2016-08-22 MED ORDER — PROCHLORPERAZINE EDISYLATE 5 MG/ML IJ SOLN
10.0000 mg | Freq: Once | INTRAMUSCULAR | Status: AC
  Administered 2016-08-22: 10 mg via INTRAVENOUS
  Filled 2016-08-22: qty 2

## 2016-08-22 MED ORDER — HYDROCODONE-HOMATROPINE 5-1.5 MG/5ML PO SYRP
3.0000 mL | ORAL_SOLUTION | Freq: Once | ORAL | Status: AC
  Administered 2016-08-22: 3 mL via ORAL
  Filled 2016-08-22: qty 5

## 2016-08-22 MED ORDER — CETIRIZINE HCL 10 MG PO TABS: 10.0000 mg | ORAL_TABLET | Freq: Every day | ORAL | 1 refills | Status: DC

## 2016-08-22 MED ORDER — PREDNISONE 20 MG PO TABS: 40.0000 mg | ORAL_TABLET | Freq: Every day | ORAL | 0 refills | Status: DC

## 2016-08-22 MED ORDER — DIPHENHYDRAMINE HCL 50 MG/ML IJ SOLN
12.5000 mg | Freq: Once | INTRAMUSCULAR | Status: DC
  Filled 2016-08-22: qty 1

## 2016-08-22 MED ORDER — ALBUTEROL SULFATE HFA 108 (90 BASE) MCG/ACT IN AERS
2.0000 | INHALATION_SPRAY | Freq: Once | RESPIRATORY_TRACT | Status: AC
  Administered 2016-08-22: 2 via RESPIRATORY_TRACT
  Filled 2016-08-22: qty 6.7

## 2016-08-22 NOTE — Discharge Instructions (Signed)
Use 2 puffs of an albuterol inhaler every 4-6 hours for cough or shortness of breath. We recommend that you use 2 sprays of Flonase in each nostril daily for congestion. You may also use Zyrtec for congestion as needed. Take prednisone as prescribed to help improve breathing. You may use Hycodan as prescribed for cough. Follow up with your primary care doctor to ensure resolution of symptoms.

## 2016-08-22 NOTE — ED Provider Notes (Signed)
[facility]-EMERGENCY DEPT Provider Note   CSN: [id number] Arrival date & time: 08/21/16  2313  By signing my name below, I, Majel Homer, attest that this documentation has been prepared under the direction and in the presence of [employer], PA-C. Electronically Signed: Majel Homer, Scribe. 08/22/2016. 1:11 AM.   History   Chief Complaint Chief Complaint  Patient presents with  . Chest Pain  . Weakness   The history is provided by the patient. No language interpreter was used.   HPI Comments: Katara Griner is a 61 y.o. female with PMHx of hypoglycemia, who presents to the Emergency Department complaining of gradually worsening, productive cough that began 3 days ago. Pt reports associated fever (103F rectally at home), congestion, rhinorrhea, postnasal drip, bilateral ear pain, generalized myalgias, dizziness described as room-spinning, mild lightheadedness, blurry vision and "burning" in her right eye, headache centralized around her sinuses, and nausea. She notes she has used throat lozenges and Clear Lungs capsules every 4 hours with mild relief of her symptoms. She states she has been around many young children with similar symptoms at her work. Pt denies any vomiting or diarrhea.    Past Medical History:  Diagnosis Date  . Glaucoma   . Hypoglycemia    There are no active problems to display for this patient.  Past Surgical History:  Procedure Laterality Date  . ABDOMINAL HYSTERECTOMY    . CESAREAN SECTION    . EYE SURGERY    . TONSILLECTOMY      OB History    No data available     Home Medications    Prior to Admission medications   Medication Sig Start Date End Date Taking? Authorizing Provider  Ascorbic Acid (VITAMIN C) 1000 MG tablet Take 1,000 mg by mouth at bedtime.     Historical Provider, MD  b complex vitamins tablet Take 1 tablet by mouth at bedtime.     Historical Provider, MD  BENFOTIAMINE PO Take 500 mg by mouth 2 (two) times daily.    Historical Provider, MD    Calcium Citrate-Vitamin D (CALCIUM CITRATE + D3 PO) Take 630 mg by mouth at bedtime.    Historical Provider, MD  Cholecalciferol (VITAMIN D-3) 5000 UNITS TABS Take 5,000 Units by mouth daily.     Historical Provider, MD  Coenzyme Q10 (CO Q-10) 100 MG CAPS Take 100 mg by mouth at bedtime.     Historical Provider, MD  cyclobenzaprine (FLEXERIL) 10 MG tablet Take 10 mg by mouth at bedtime.    Historical Provider, MD  diclofenac sodium (VOLTAREN) 1 % GEL Apply 1 application topically 2 (two) times daily as needed (pain).    Historical Provider, MD  latanoprost (XALATAN) 0.005 % ophthalmic solution Place 1 drop into the right eye at bedtime.  06/14/13   Historical Provider, MD  Magnesium 250 MG TABS Take 500 mg by mouth 2 (two) times daily.    Historical Provider, MD  naproxen (NAPROSYN) 375 MG tablet Take 1 tablet (375 mg total) by mouth 2 (two) times daily. 01/01/14   Blane Ohara, MD  naproxen (NAPROSYN) 500 MG tablet Take 500 mg by mouth 2 (two) times daily with a meal.    Historical Provider, MD  Omega-3 Fatty Acids (FISH OIL) 1000 MG CAPS Take 1,000 mg by mouth daily.    Historical Provider, MD  Polyethyl Glycol-Propyl Glycol (SYSTANE OP) Place 1 drop into both eyes every hour as needed (dry eyes).    Historical Provider, MD  Soya Lecithin 1200 MG CAPS  Take 2,400 mg by mouth at bedtime.     Historical Provider, MD   Family History Family History  Problem Relation Age of Onset  . Cancer Mother   . Hypertension Father   . Diabetes Sister     Social History Social History  Substance Use Topics  . Smoking status: Never Smoker  . Smokeless tobacco: Not on file  . Alcohol use No   Allergies   Motrin [ibuprofen]; Sulfa antibiotics; Beta adrenergic blockers; Contrast media [iodinated diagnostic agents]; Gluten meal; Macrobid [nitrofurantoin monohyd macro]; Oxycodone-acetaminophen; Oxycodone-aspirin; Ultram [tramadol]; Wheat bran; Yeast-related products; and Sugar-protein-starch    Review of  Systems Review of Systems  Ten systems reviewed and are negative for acute change, except as noted in the HPI.    Physical Exam Updated Vital Signs BP 100/69   Pulse 84   Temp 99.7 F (37.6 C) (Oral)   Resp 18   Ht  (1.6 m)   Wt 176 lb (79.8 kg)   SpO2 95%   BMI 31.18 kg/m   Physical Exam  Constitutional: She is oriented to person, place, and time. She appears well-developed and well-nourished. No distress.  Patient nontoxic appearing and in no distress.  HENT:  Head: Normocephalic and atraumatic.  Right Ear: Hearing, external ear and ear canal normal.  Left Ear: Hearing, external ear and ear canal normal.  Nose: Mucosal edema present. No rhinorrhea, septal deviation or nasal septal hematoma.  Mouth/Throat: Uvula is midline, oropharynx is clear and moist and mucous membranes are normal.  Oropharynx clear. Uvula midline. Patient tolerating secretions without difficulty.  Eyes: Conjunctivae and EOM are normal. Pupils are equal, round, and reactive to light. No scleral icterus.  Neck: Normal range of motion.  No meningismus  Cardiovascular: Normal rate, regular rhythm and intact distal pulses.   Pulmonary/Chest: Effort normal. No respiratory distress. She has no wheezes. She has no rales.  Lungs are grossly clear. Chest expansion symmetric. No rales appreciated.  Musculoskeletal: Normal range of motion.  Neurological: She is alert and oriented to person, place, and time. No cranial nerve deficit. She exhibits normal muscle tone. Coordination normal.  GCS 15. Speech is goal oriented. No focal neurologic deficits appreciated. Patient moving all extremities.  Skin: Skin is warm and dry. No rash noted. She is not diaphoretic. No erythema. No pallor.  Psychiatric: She has a normal mood and affect. Her behavior is normal.  Nursing note and vitals reviewed.   ED Treatments / Results  DIAGNOSTIC STUDIES:  Oxygen Saturation is 95% on RA, adequate by my interpretation.     COORDINATION OF CARE:  1:07 AM Discussed treatment plan with pt at bedside and pt agreed to plan.  Labs (all labs ordered are listed, but only abnormal results are displayed) Labs Reviewed  BASIC METABOLIC PANEL - Abnormal; Notable for the following:       Result Value   Glucose, Bld 109 (*)    All other components within normal limits  CBC  I-STAT TROPOININ, ED    EKG  EKG Interpretation  Date/Time:  Tuesday August 21 2016 23:27:26 EDT Ventricular Rate:  97 PR Interval:    QRS Duration: 76 QT Interval:  323 QTC Calculation: 411 R Axis:   57 Text Interpretation:  Sinus rhythm Probable left atrial enlargement Low voltage, precordial leads No old tracing to compare Confirmed by CAMPOS  MD, Caryn Bee ([id number]) on 08/22/2016 1:10:28 AM Also confirmed by Patria Mane  MD, Caryn Bee ([id number]), editor Valentina Lucks CT, Jola Babinski [phone])  on 08/22/2016 7:55:43  AM       Radiology Dg Chest 2 View  Result Date: 08/21/2016 CLINICAL DATA:  Acute onset of cough, congestion and fever. Body aches. Initial encounter. EXAM: CHEST  2 VIEW COMPARISON:  Chest radiograph performed 02/10/2016 FINDINGS: The lungs are well-aerated and clear. There is no evidence of focal opacification, pleural effusion or pneumothorax. The heart is normal in size; the mediastinal contour is within normal limits. No acute osseous abnormalities are seen. IMPRESSION: No acute cardiopulmonary process seen. Electronically Signed   By: Roanna Raider M.D.   On: 08/21/2016 23:55    Procedures Procedures (including critical care time)  Medications Ordered in ED Medications  sodium chloride 0.9 % bolus 1,000 mL (0 mLs Intravenous Stopped 08/22/16 0307)  HYDROcodone-homatropine (HYCODAN) 5-1.5 MG/5ML syrup 3 mL (3 mLs Oral Given 08/22/16 0117)  benzonatate (TESSALON) capsule 100 mg (100 mg Oral Given 08/22/16 0116)  methylPREDNISolone sodium succinate (SOLU-MEDROL) 125 mg/2 mL injection 125 mg (125 mg Intravenous Given 08/22/16 0127)   ipratropium-albuterol (DUONEB) 0.5-2.5 (3) MG/3ML nebulizer solution 3 mL (3 mLs Nebulization Given 08/22/16 0118)  prochlorperazine (COMPAZINE) injection 10 mg (10 mg Intravenous Given 08/22/16 0128)  albuterol (PROVENTIL HFA;VENTOLIN HFA) 108 (90 Base) MCG/ACT inhaler 2 puff (2 puffs Inhalation Given 08/22/16 0316)     Initial Impression / Assessment and Plan / ED Course  I have reviewed the triage vital signs and the nursing notes.  Pertinent labs & imaging results that were available during my care of the patient were reviewed by me and considered in my medical decision making (see chart for details).     Patient complaining of symptoms of URI, likely viral etiology. Mild to moderate symptoms of clear/yellow nasal discharge/congestion and scratchy throat with cough for less than 10 days. Patient is afebrile while in the ED. She feel improved symptoms on repeat assessment, following supportive management in the ED. Will continue with symptom management on an outpatient basis. Patient given instructions for follow-up with primary care physician. Return precautions discussed and provided. Patient discharged in stable condition with no unaddressed concerns. She was seen and evaluated, also, by my attending prior to discharge. Dr. Patria Mane is in agreement with this patient's workup, assessment, management plan, and patient's stability for discharge.  I personally performed the services described in this documentation, which was scribed in my presence. The recorded information has been reviewed and is accurate.    Final Clinical Impressions(s) / ED Diagnoses   Final diagnoses:  Upper respiratory infection, acute    New Prescriptions Discharge Medication List as of 08/22/2016  3:14 AM    START taking these medications   Details  cetirizine (ZYRTEC ALLERGY) 10 MG tablet Take 1 tablet (10 mg total) by mouth daily., Starting Wed 08/22/2016, Print    HYDROcodone-homatropine (HYCODAN) 5-1.5 MG/5ML  syrup Take 3 mLs by mouth every 8 (eight) hours as needed for cough., Starting Wed 08/22/2016, Print    predniSONE (DELTASONE) 20 MG tablet Take 2 tablets (40 mg total) by mouth daily., Starting Wed 08/22/2016, Print         [place], PA-C 08/29/16 1610    Azalia Bilis, MD 08/30/16 [phone]

## 2016-08-29 DIAGNOSIS — J069 Acute upper respiratory infection, unspecified: Secondary | ICD-10-CM | POA: Diagnosis not present

## 2016-08-29 DIAGNOSIS — J40 Bronchitis, not specified as acute or chronic: Secondary | ICD-10-CM | POA: Diagnosis not present

## 2016-08-29 DIAGNOSIS — R5383 Other fatigue: Secondary | ICD-10-CM | POA: Diagnosis not present

## 2016-08-29 DIAGNOSIS — J309 Allergic rhinitis, unspecified: Secondary | ICD-10-CM | POA: Diagnosis not present

## 2016-08-30 DIAGNOSIS — H44512 Absolute glaucoma, left eye: Secondary | ICD-10-CM | POA: Diagnosis not present

## 2016-08-30 DIAGNOSIS — H401114 Primary open-angle glaucoma, right eye, indeterminate stage: Secondary | ICD-10-CM | POA: Diagnosis not present

## 2016-09-03 DIAGNOSIS — K5900 Constipation, unspecified: Secondary | ICD-10-CM | POA: Diagnosis not present

## 2016-09-03 DIAGNOSIS — E559 Vitamin D deficiency, unspecified: Secondary | ICD-10-CM | POA: Diagnosis not present

## 2016-09-05 DIAGNOSIS — K5900 Constipation, unspecified: Secondary | ICD-10-CM | POA: Diagnosis not present

## 2016-09-05 DIAGNOSIS — J45909 Unspecified asthma, uncomplicated: Secondary | ICD-10-CM | POA: Diagnosis not present

## 2016-09-05 DIAGNOSIS — H401112 Primary open-angle glaucoma, right eye, moderate stage: Secondary | ICD-10-CM | POA: Diagnosis not present

## 2016-09-05 DIAGNOSIS — H44512 Absolute glaucoma, left eye: Secondary | ICD-10-CM | POA: Diagnosis not present

## 2016-09-05 DIAGNOSIS — H548 Legal blindness, as defined in USA: Secondary | ICD-10-CM | POA: Diagnosis not present

## 2016-09-05 DIAGNOSIS — J309 Allergic rhinitis, unspecified: Secondary | ICD-10-CM | POA: Diagnosis not present

## 2016-09-05 DIAGNOSIS — E559 Vitamin D deficiency, unspecified: Secondary | ICD-10-CM | POA: Diagnosis not present

## 2016-09-14 ENCOUNTER — Other Ambulatory Visit: Payer: Self-pay | Admitting: Family Medicine

## 2016-09-14 DIAGNOSIS — N390 Urinary tract infection, site not specified: Secondary | ICD-10-CM

## 2016-09-19 ENCOUNTER — Ambulatory Visit
Admission: RE | Admit: 2016-09-19 | Discharge: 2016-09-19 | Disposition: A | Discharge status: Home / Self Care | Payer: Medicare HMO | Source: Ambulatory Visit | Attending: Family Medicine | Admitting: Family Medicine

## 2016-09-19 DIAGNOSIS — N390 Urinary tract infection, site not specified: Secondary | ICD-10-CM | POA: Diagnosis not present

## 2016-10-22 DIAGNOSIS — H401112 Primary open-angle glaucoma, right eye, moderate stage: Secondary | ICD-10-CM | POA: Diagnosis not present

## 2016-10-22 DIAGNOSIS — H44512 Absolute glaucoma, left eye: Secondary | ICD-10-CM | POA: Diagnosis not present

## 2016-11-21 DIAGNOSIS — K219 Gastro-esophageal reflux disease without esophagitis: Secondary | ICD-10-CM | POA: Diagnosis not present

## 2016-11-21 DIAGNOSIS — J45909 Unspecified asthma, uncomplicated: Secondary | ICD-10-CM | POA: Diagnosis not present

## 2016-11-21 DIAGNOSIS — J309 Allergic rhinitis, unspecified: Secondary | ICD-10-CM | POA: Diagnosis not present

## 2016-11-21 DIAGNOSIS — Z6832 Body mass index (BMI) 32.0-32.9, adult: Secondary | ICD-10-CM | POA: Diagnosis not present

## 2017-01-03 DIAGNOSIS — H401112 Primary open-angle glaucoma, right eye, moderate stage: Secondary | ICD-10-CM | POA: Diagnosis not present

## 2017-01-04 DIAGNOSIS — Z1159 Encounter for screening for other viral diseases: Secondary | ICD-10-CM | POA: Diagnosis not present

## 2017-01-04 DIAGNOSIS — Z23 Encounter for immunization: Secondary | ICD-10-CM | POA: Diagnosis not present

## 2017-01-04 DIAGNOSIS — Z111 Encounter for screening for respiratory tuberculosis: Secondary | ICD-10-CM | POA: Diagnosis not present

## 2017-01-04 DIAGNOSIS — Z6833 Body mass index (BMI) 33.0-33.9, adult: Secondary | ICD-10-CM | POA: Diagnosis not present

## 2017-02-05 DIAGNOSIS — Z23 Encounter for immunization: Secondary | ICD-10-CM | POA: Diagnosis not present

## 2017-02-06 DIAGNOSIS — R52 Pain, unspecified: Secondary | ICD-10-CM | POA: Diagnosis not present

## 2017-02-06 DIAGNOSIS — I8393 Asymptomatic varicose veins of bilateral lower extremities: Secondary | ICD-10-CM | POA: Diagnosis not present

## 2017-02-06 DIAGNOSIS — B372 Candidiasis of skin and nail: Secondary | ICD-10-CM | POA: Diagnosis not present

## 2017-02-06 DIAGNOSIS — Z6833 Body mass index (BMI) 33.0-33.9, adult: Secondary | ICD-10-CM | POA: Diagnosis not present

## 2017-03-07 DIAGNOSIS — J069 Acute upper respiratory infection, unspecified: Secondary | ICD-10-CM | POA: Diagnosis not present

## 2017-03-16 IMAGING — CT CT ABDOMEN W/O CM
2 of 4 series · 16 of 46 positions shown, 18 images · non-contrast
Comparison: 02/10/2016 ultrasound.

CLINICAL DATA: 59-year-old female with right mid abdominal bulging
for several years. Question hernia. Prior partial hysterectomy and
colonic resection secondary to infection. No injury. Subsequent
encounter.

EXAM:
CT ABDOMEN WITHOUT CONTRAST
TECHNIQUE: Multidetector CT imaging of the abdomen was performed following the
standard protocol without IV contrast.

[Series 2: abd w/(date) · axial · 0.65mm/px · z∈[+825,+1030]mm · 13 of 45 slices shown, 15 images]
[im 2/45  soft-tissue]
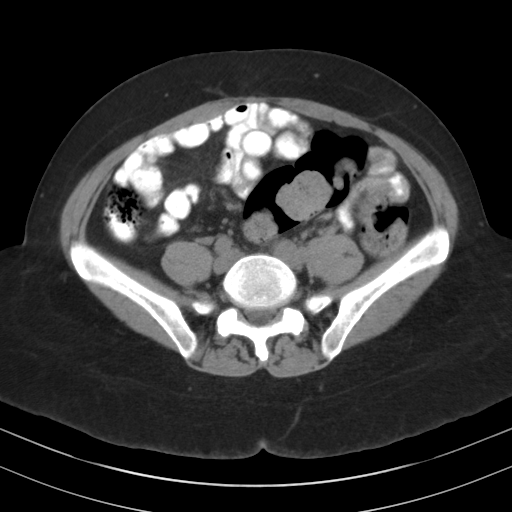
[im 2/45  bone]
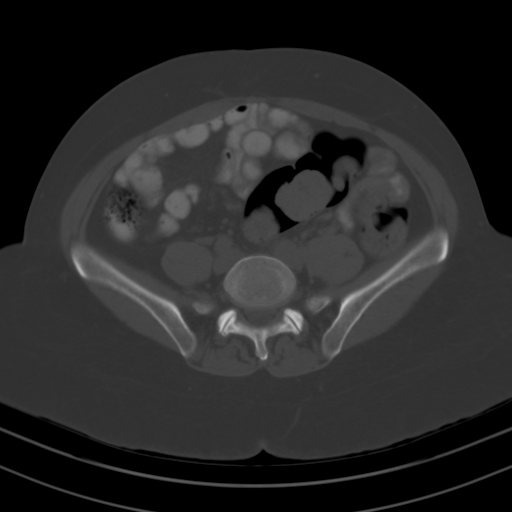
[im 6/45  soft-tissue]
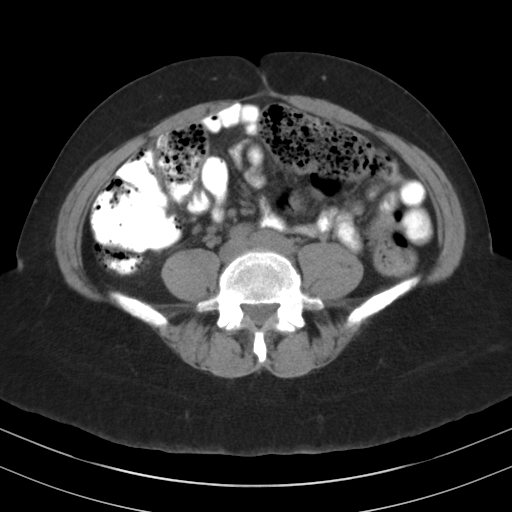
[im 10/45  soft-tissue]
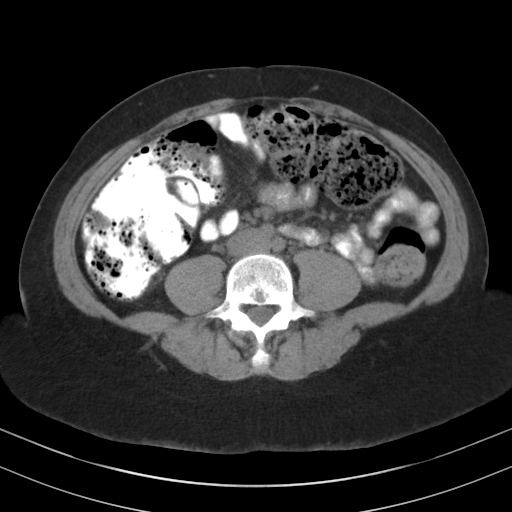
[im 12/45  soft-tissue]
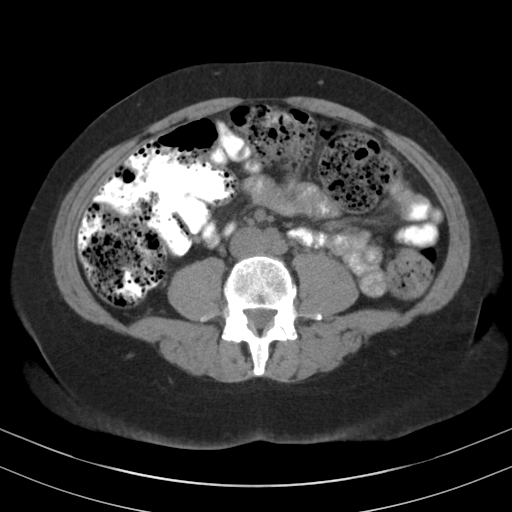
[im 16/45  soft-tissue]
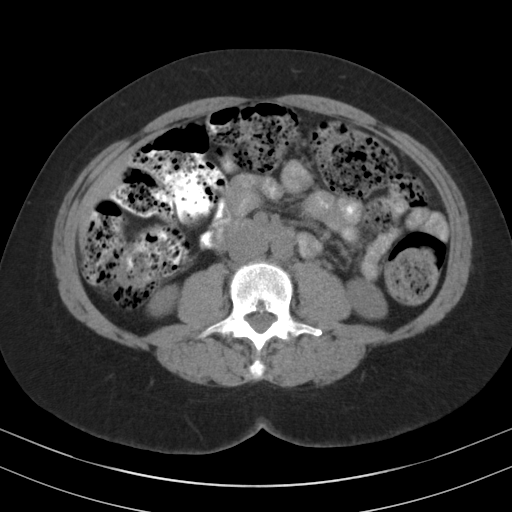
[im 20/45  soft-tissue]
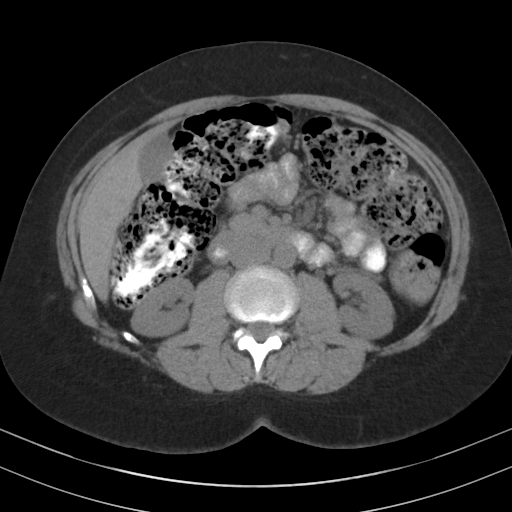
[im 23/45  soft-tissue]
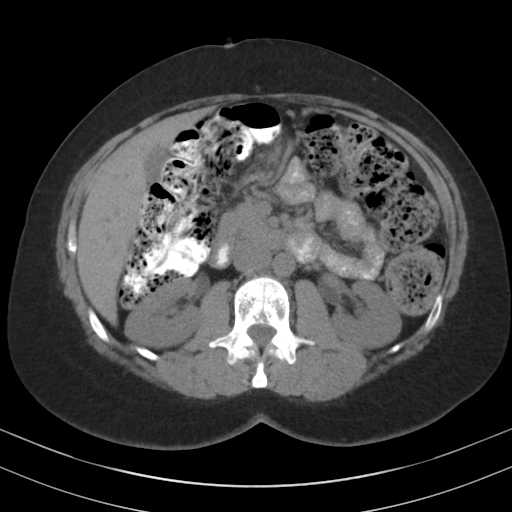
[im 25/45  soft-tissue]
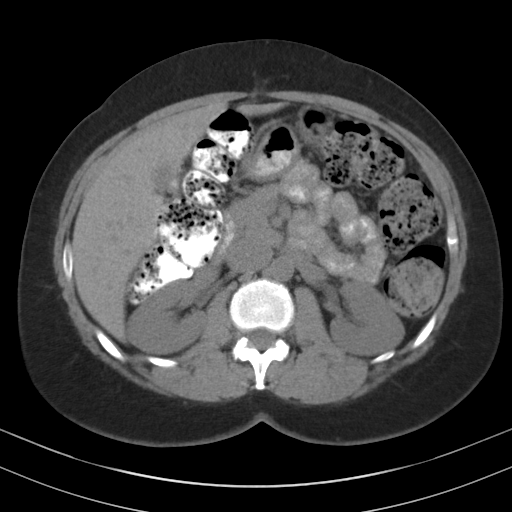
[im 29/45  soft-tissue]
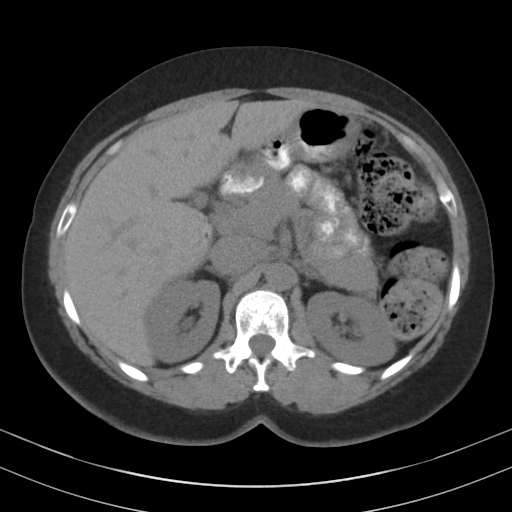
[im 29/45  bone]
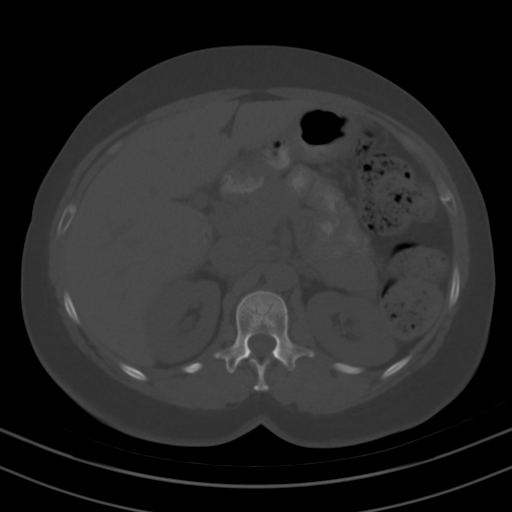
[im 33/45  soft-tissue]
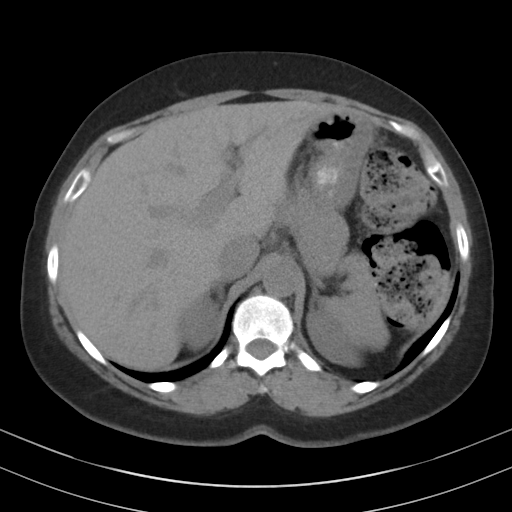
[im 35/45  soft-tissue]
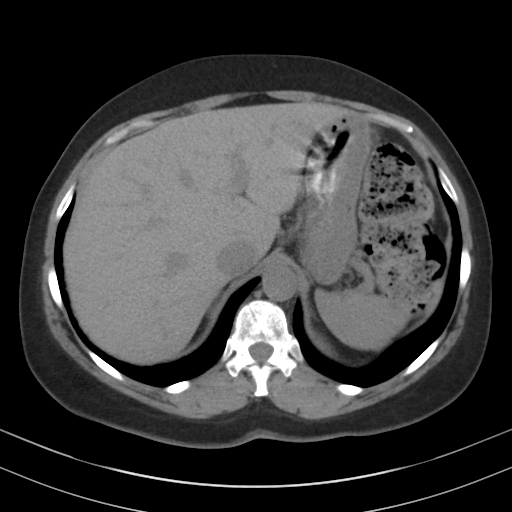
[im 39/45  soft-tissue]
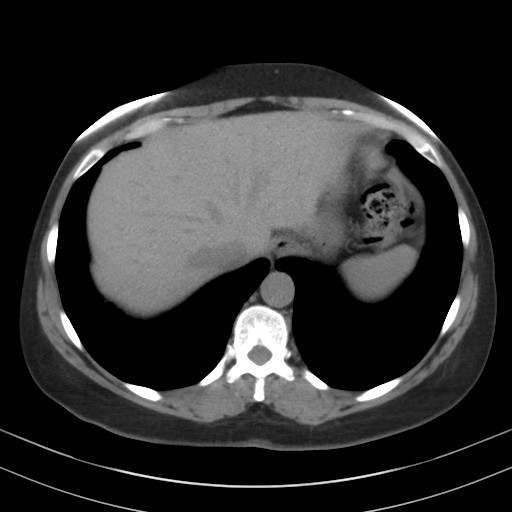
[im 43/45  soft-tissue]
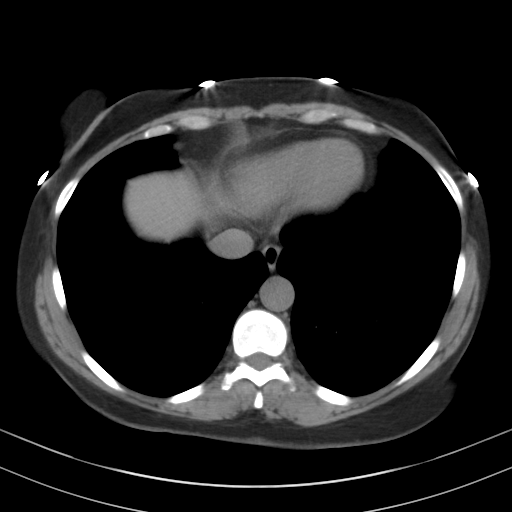

[Series 3: cor abd · coronal · 0.45mm/px · 3 of 92 slices shown]
[im 31/92  soft-tissue]
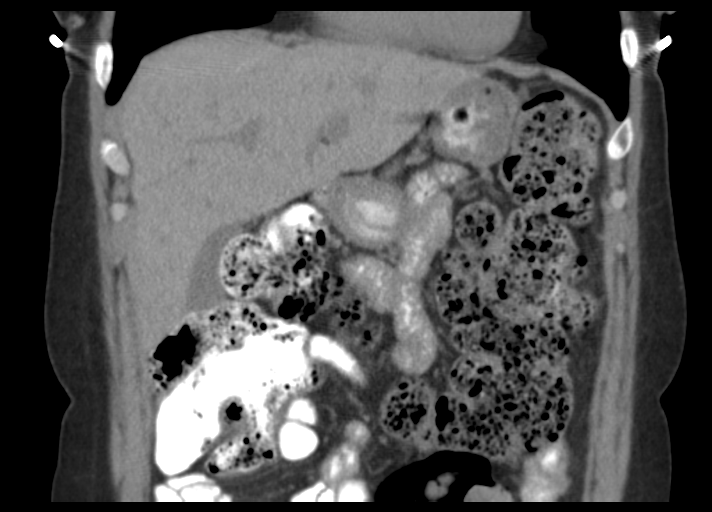
[im 41/92  soft-tissue]
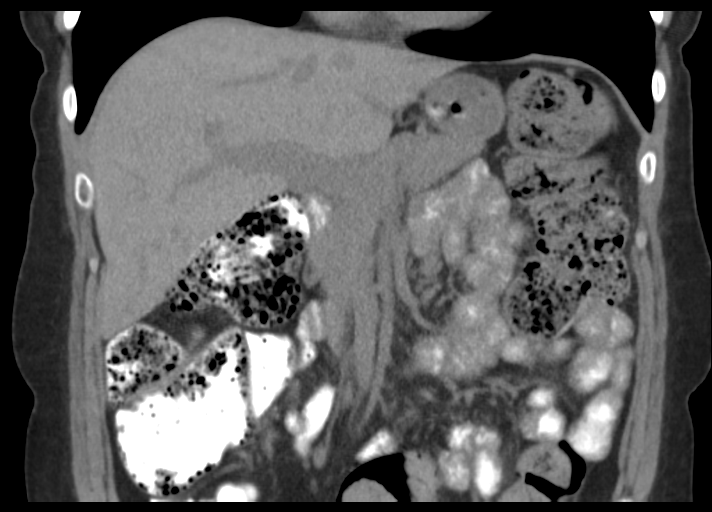
[im 51/92  soft-tissue]
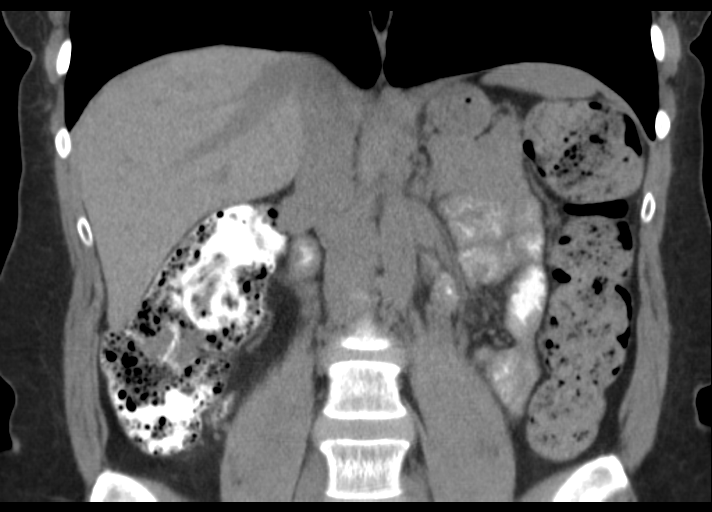

[16 of 46 positions shown; findings below may reference images not displayed]

FINDINGS: Lower chest: Clear.

Hepatobiliary: Taking into account limitation by non contrast
imaging, no worrisome mass. No calcified gallstones.

Pancreas: Taking into account limitation by non contrast imaging, no
inflammation or mass.

Spleen: Taking into account limitation by non contrast imaging, no
enlargement or mass.

Adrenals/Urinary Tract: Taking into account limitation by non
contrast imaging, no renal or adrenal mass. No obstructing stone or
hydronephrosis.

Stomach/Bowel: Under distended stomach. No extra luminal bowel
inflammatory process, free fluid or free air.

Vascular/Lymphatic: No abdominal aortic aneurysm.  No adenopathy.

Other: Upper mid abdominal ventral hernia containing fat and vessel
measuring up to 3 cm. No bowel extends into this region. This is
located 9 cm superior to the umbilicus.

Musculoskeletal: Negative.
IMPRESSION: Upper mid abdominal ventral hernia containing fat and vessel
measuring up to 3 cm. No bowel extends into this region. This is
located 9 cm superior to the umbilicus.

## 2017-03-21 DIAGNOSIS — J309 Allergic rhinitis, unspecified: Secondary | ICD-10-CM | POA: Diagnosis not present

## 2017-03-21 DIAGNOSIS — J069 Acute upper respiratory infection, unspecified: Secondary | ICD-10-CM | POA: Diagnosis not present

## 2017-03-21 DIAGNOSIS — K219 Gastro-esophageal reflux disease without esophagitis: Secondary | ICD-10-CM | POA: Diagnosis not present

## 2017-03-21 DIAGNOSIS — J40 Bronchitis, not specified as acute or chronic: Secondary | ICD-10-CM | POA: Diagnosis not present

## 2017-03-27 DIAGNOSIS — R5383 Other fatigue: Secondary | ICD-10-CM | POA: Diagnosis not present

## 2017-03-27 DIAGNOSIS — Z0000 Encounter for general adult medical examination without abnormal findings: Secondary | ICD-10-CM | POA: Diagnosis not present

## 2017-03-27 DIAGNOSIS — J069 Acute upper respiratory infection, unspecified: Secondary | ICD-10-CM | POA: Diagnosis not present

## 2017-03-27 DIAGNOSIS — Z1211 Encounter for screening for malignant neoplasm of colon: Secondary | ICD-10-CM | POA: Diagnosis not present

## 2017-03-27 DIAGNOSIS — Z6830 Body mass index (BMI) 30.0-30.9, adult: Secondary | ICD-10-CM | POA: Diagnosis not present

## 2017-03-27 DIAGNOSIS — R69 Illness, unspecified: Secondary | ICD-10-CM | POA: Diagnosis not present

## 2017-04-02 DIAGNOSIS — T50905D Adverse effect of unspecified drugs, medicaments and biological substances, subsequent encounter: Secondary | ICD-10-CM | POA: Diagnosis not present

## 2017-04-02 DIAGNOSIS — R69 Illness, unspecified: Secondary | ICD-10-CM | POA: Diagnosis not present

## 2017-04-02 DIAGNOSIS — Z6832 Body mass index (BMI) 32.0-32.9, adult: Secondary | ICD-10-CM | POA: Diagnosis not present

## 2017-04-16 DIAGNOSIS — K219 Gastro-esophageal reflux disease without esophagitis: Secondary | ICD-10-CM | POA: Diagnosis not present

## 2017-04-16 DIAGNOSIS — F411 Generalized anxiety disorder: Secondary | ICD-10-CM | POA: Diagnosis not present

## 2017-04-16 DIAGNOSIS — R69 Illness, unspecified: Secondary | ICD-10-CM | POA: Diagnosis not present

## 2017-05-24 ENCOUNTER — Other Ambulatory Visit: Payer: Self-pay

## 2017-05-24 ENCOUNTER — Emergency Department (HOSPITAL_COMMUNITY): Payer: Worker's Compensation

## 2017-05-24 ENCOUNTER — Encounter (HOSPITAL_COMMUNITY): Payer: Self-pay | Admitting: Obstetrics and Gynecology

## 2017-05-24 ENCOUNTER — Emergency Department (HOSPITAL_COMMUNITY)
Admission: EM | Admit: 2017-05-24 | Discharge: 2017-05-24 | Disposition: A | Discharge status: Home / Self Care | Payer: Worker's Compensation | Attending: Emergency Medicine | Admitting: Emergency Medicine

## 2017-05-24 DIAGNOSIS — Y9301 Activity, walking, marching and hiking: Secondary | ICD-10-CM | POA: Diagnosis not present

## 2017-05-24 DIAGNOSIS — W19XXXA Unspecified fall, initial encounter: Secondary | ICD-10-CM

## 2017-05-24 DIAGNOSIS — S8001XA Contusion of right knee, initial encounter: Secondary | ICD-10-CM | POA: Diagnosis not present

## 2017-05-24 DIAGNOSIS — X509XXA Other and unspecified overexertion or strenuous movements or postures, initial encounter: Secondary | ICD-10-CM | POA: Diagnosis not present

## 2017-05-24 DIAGNOSIS — Y990 Civilian activity done for income or pay: Secondary | ICD-10-CM | POA: Insufficient documentation

## 2017-05-24 DIAGNOSIS — Y929 Unspecified place or not applicable: Secondary | ICD-10-CM | POA: Diagnosis not present

## 2017-05-24 DIAGNOSIS — S8991XA Unspecified injury of right lower leg, initial encounter: Secondary | ICD-10-CM | POA: Diagnosis present

## 2017-05-24 DIAGNOSIS — Z79899 Other long term (current) drug therapy: Secondary | ICD-10-CM | POA: Diagnosis not present

## 2017-05-24 LAB — I-STAT CHEM 8, ED
BUN: 14 mg/dL (ref 6–20)
Calcium, Ion: 1.15 mmol/L (ref 1.15–1.40)
Chloride: 107 mmol/L (ref 101–111)
Creatinine, Ser: 0.7 mg/dL (ref 0.44–1.00)
Glucose, Bld: 80 mg/dL (ref 65–99)
HCT: 37 % (ref 36.0–46.0)
Hemoglobin: 12.6 g/dL (ref 12.0–15.0)
POTASSIUM: 4.2 mmol/L (ref 3.5–5.1)
SODIUM: 144 mmol/L (ref 135–145)
TCO2: 25 mmol/L (ref 22–32)

## 2017-05-24 LAB — CBC WITH DIFFERENTIAL/PLATELET
BASOS PCT: 0 %
Basophils Absolute: 0 10*3/uL (ref 0.0–0.1)
EOS ABS: 0 10*3/uL (ref 0.0–0.7)
Eosinophils Relative: 0 %
HCT: 37.7 % (ref 36.0–46.0)
HEMOGLOBIN: 12.6 g/dL (ref 12.0–15.0)
LYMPHS ABS: 2.2 10*3/uL (ref 0.7–4.0)
Lymphocytes Relative: 40 %
MCH: 29.3 pg (ref 26.0–34.0)
MCHC: 33.4 g/dL (ref 30.0–36.0)
MCV: 87.7 fL (ref 78.0–100.0)
Monocytes Absolute: 0.4 10*3/uL (ref 0.1–1.0)
Monocytes Relative: 7 %
NEUTROS PCT: 53 %
Neutro Abs: 3 10*3/uL (ref 1.7–7.7)
Platelets: 226 10*3/uL (ref 150–400)
RBC: 4.3 MIL/uL (ref 3.87–5.11)
RDW: 14 % (ref 11.5–15.5)
WBC: 5.6 10*3/uL (ref 4.0–10.5)

## 2017-05-24 MED ORDER — ONDANSETRON HCL 4 MG/2ML IJ SOLN
4.0000 mg | Freq: Once | INTRAMUSCULAR | Status: AC
  Administered 2017-05-24: 4 mg via INTRAVENOUS
  Filled 2017-05-24: qty 2

## 2017-05-24 MED ORDER — ACETAMINOPHEN 325 MG PO TABS
650.0000 mg | ORAL_TABLET | Freq: Once | ORAL | Status: AC
  Administered 2017-05-24: 650 mg via ORAL
  Filled 2017-05-24: qty 2

## 2017-05-24 MED ORDER — MORPHINE SULFATE (PF) 4 MG/ML IV SOLN
4.0000 mg | Freq: Once | INTRAVENOUS | Status: AC
  Administered 2017-05-24: 4 mg via INTRAVENOUS
  Filled 2017-05-24: qty 1

## 2017-05-24 MED ORDER — CYCLOBENZAPRINE HCL 5 MG PO TABS: 5.0000 mg | ORAL_TABLET | Freq: Two times a day (BID) | ORAL | 0 refills | Status: DC | PRN

## 2017-05-24 NOTE — Discharge Instructions (Signed)
You have been evaluated for your fall.  Fortunately no evidence of broken bones to your knees and hip/pelvic region.  Please use ice pack, rest, use ace wrap and take advil as needed for pain.  Follow up with orthopedist as needed. Return if you have any concerns.

## 2017-05-24 NOTE — ED Triage Notes (Signed)
Per EMS: Pt was at work [employer] and fell and hit concrete and hurt her knee. PT tripped over a box and landed on her right knee. No LOC or hit her head. EMS reports swelling to the knee.    Last Vitals: BP 117/80 HR 60 RR 18 SPO2 99% on RA CBG 87   Pt was given 4 of zofran and 150 mcgs of fentanyl.

## 2017-05-24 NOTE — ED Provider Notes (Signed)
[facility]-EMERGENCY DEPT Provider Note   CSN: [id number] Arrival date & time: 05/24/17  1450     History   Chief Complaint Chief Complaint  Patient presents with  . Fall  . Knee Pain    HPI Caasi Giglia is a 62 y.o. female.  HPI   62 year old female with history of glaucoma brought here via EMS from work for evaluation of a recent fall.  Patient report that she was walking at work, and accidentally stepped on a slippery plastic tray and fell forward striking her knees against the floor.  She reports exquisite tenderness to her right knee that radiates up her thigh and down her right leg.  She also complaining of some pain in her left knee on the medial aspect.  Reports tingling sensation to his right leg.  Pain radiates to her lower back.  She was unable to ambulate afterward.  She denies hitting her head or loss of consciousness but does complain of tenderness to the left side of her neck.  Denies any precipitating symptoms prior to the fall.  No chest pain or trouble breathing, no abdominal pain.  She did receive fentanyl and Zofran prior to arrival and states that it makes her sick.  EMS did report swelling to her right knee and the knee was immobilized prior to my evaluation.  Past Medical History:  Diagnosis Date  . Glaucoma   . Hypoglycemia     There are no active problems to display for this patient.   Past Surgical History:  Procedure Laterality Date  . ABDOMINAL HYSTERECTOMY    . CESAREAN SECTION    . EYE SURGERY    . TONSILLECTOMY      OB History    Gravida Para Term Preterm AB Living             2   SAB TAB Ectopic Multiple Live Births                   Home Medications    Prior to Admission medications   Medication Sig Start Date End Date Taking? Authorizing Provider  cetirizine (ZYRTEC ALLERGY) 10 MG tablet Take 1 tablet (10 mg total) by mouth daily. 08/22/16   Antony Madura, PA-C  HYDROcodone-homatropine (HYCODAN) 5-1.5 MG/5ML  syrup Take 3 mLs by mouth every 8 (eight) hours as needed for cough. 08/22/16   Antony Madura, PA-C  latanoprost (XALATAN) 0.005 % ophthalmic solution Place 1 drop into the right eye at bedtime.  06/14/13   [provider]  loratadine (CLARITIN) 10 MG tablet Take 10 mg by mouth daily.    [provider]  montelukast (SINGULAIR) 10 MG tablet Take 10 mg by mouth at bedtime.    [provider]  naproxen (NAPROSYN) 375 MG tablet Take 1 tablet (375 mg total) by mouth 2 (two) times daily. Patient not taking: Reported on 08/22/2016 01/01/14   Blane Ohara, MD  omeprazole (PRILOSEC) 40 MG capsule Take 40 mg by mouth daily.    [provider]  predniSONE (DELTASONE) 20 MG tablet Take 2 tablets (40 mg total) by mouth daily. 08/22/16   Antony Madura, PA-C  ranitidine (ZANTAC) 300 MG tablet Take 300 mg by mouth at bedtime. 07/11/16   [provider]    Family History Family History  Problem Relation Age of Onset  . Cancer Mother   . Hypertension Father   . Diabetes Sister     Social History Social History   Tobacco Use  .  Smoking status: Never Smoker  . Smokeless tobacco: Never Used  Substance Use Topics  . Alcohol use: No  . Drug use: Not on file     Allergies   Motrin [ibuprofen]; Sulfa antibiotics; Beta adrenergic blockers; Contrast media [iodinated diagnostic agents]; Gluten meal; Macrobid [nitrofurantoin monohyd macro]; Oxycodone-acetaminophen; Oxycodone-aspirin; Ultram [tramadol]; Wheat bran; Yeast-related products; and Sugar-protein-starch   Review of Systems Review of Systems  All other systems reviewed and are negative.    Physical Exam Updated Vital Signs BP (!) 130/94 (BP Location: Left Arm)   Pulse (!) 59   Temp 98.2 F (36.8 C) (Oral)   Resp 17   Ht 5' 3.5" (1.613 m)   Wt 83.9 kg (185 lb)   SpO2 100%   BMI 32.26 kg/m   Physical Exam  Constitutional: She appears well-developed and well-nourished. No distress.  Patient  appears uncomfortable, moaning  HENT:  Head: Normocephalic and atraumatic.  No scalp tenderness  Eyes: Conjunctivae are normal.  Cloudy of the cornea and iris of the left eye consistent with glaucoma  Neck: Normal range of motion. Neck supple.  Tenderness to left paracervical spinal muscle and left trapezius muscle without any significant midline cervical spine tenderness crepitus or step-off  Cardiovascular: Normal rate, regular rhythm and intact distal pulses.  Pulmonary/Chest: Effort normal and breath sounds normal.  Abdominal: Soft. She exhibits no distension. There is no tenderness.  Musculoskeletal: She exhibits tenderness (Right lower extremity: Exquisite tenderness to palpation of the right anterior knee with some swelling noted.  Decreased range of motion secondary to pain.  No obvious deformity appreciated.  Tenderness throughout right leg, including hip on palpation.).  Left lower extremity: Tenderness to medial aspect of left knee with normal flexion extension and no joint laxity.  Neurological: She is alert.  Skin: No rash noted.  Psychiatric: She has a normal mood and affect.  Nursing note and vitals reviewed.    ED Treatments / Results  Labs (all labs ordered are listed, but only abnormal results are displayed) Labs Reviewed  CBC WITH DIFFERENTIAL/PLATELET  I-STAT CHEM 8, ED    EKG  EKG Interpretation None       Radiology Dg Knee Complete 4 Views Left  Result Date: 05/24/2017 CLINICAL DATA:  Fall.  Severe pain. EXAM: LEFT KNEE - COMPLETE 4+ VIEW COMPARISON:  No prior. FINDINGS: No evidence of fracture dislocation. No evidence of effusion. Nodular densities noted over the thigh most likely varicose veins. IMPRESSION: No acute abnormality. Electronically Signed   By: Maisie Fus  Register   On: 05/24/2017 16:51   Dg Knee Complete 4 Views Right  Result Date: 05/24/2017 CLINICAL DATA:  Fall. EXAM: RIGHT KNEE - COMPLETE 4+ VIEW COMPARISON:  12/12/2013 FINDINGS: No  evidence of fracture, dislocation, or joint effusion. No evidence of arthropathy or other focal bone abnormality. Soft tissues are unremarkable. IMPRESSION: Negative. Electronically Signed   By: Kennith Center M.D.   On: 05/24/2017 16:50   Dg Hip Unilat W Or Wo Pelvis 2-3 Views Right  Result Date: 05/24/2017 CLINICAL DATA:  Status post fall.  Pain. EXAM: DG HIP (WITH OR WITHOUT PELVIS) 2-3V RIGHT COMPARISON:  None. FINDINGS: There is no evidence of hip fracture or dislocation. There is no evidence of arthropathy or other focal bone abnormality. IMPRESSION: Negative. Electronically Signed   By: Ted Mcalpine M.D.   On: 05/24/2017 18:36    Procedures Procedures (including critical care time)  Medications Ordered in ED Medications  morphine 4 MG/ML injection 4 mg (4 mg Intravenous  Given 05/24/17 1703)  ondansetron (ZOFRAN) injection 4 mg (4 mg Intravenous Given 05/24/17 1703)  acetaminophen (TYLENOL) tablet 650 mg (650 mg Oral Given 05/24/17 1952)     Initial Impression / Assessment and Plan / ED Course  I have reviewed the triage vital signs and the nursing notes.  Pertinent labs & imaging results that were available during my care of the patient were reviewed by me and considered in my medical decision making (see chart for details).     BP 119/84   Pulse (!) 58   Temp 98.2 F (36.8 C) (Oral)   Resp 18   Ht 5' 3.5" (1.613 m)   Wt 83.9 kg (185 lb)   SpO2 100%   BMI 32.26 kg/m    Final Clinical Impressions(s) / ED Diagnoses   Final diagnoses:  Fall, initial encounter  Contusion of right knee, initial encounter    ED Discharge Orders        Ordered    cyclobenzaprine (FLEXERIL) 5 MG tablet  2 times daily PRN     05/24/17 2011     4:15 PM Patient suffered a mechanical fall while at work prior to arrival.  She has exquisite tenderness to her right knee as well as some tenderness to her left knee.  Tenderness extending towards her right hip.  Will obtain appropriate  imaging, pain medication given.  6:00 PM X-ray of both knees shows no acute fractures or dislocation.  Patient states her pain is more improved.  Currently awaits right hip and pelvis x-ray  8:14 PM X-ray of right hip and pelvis without acute fractures or dislocation.  Ace wrap provided to both knees for support.  Rice therapy discussed.  Outpatient follow-up with orthopedist as necessary.  Return precautions discussed.   Fayrene Helperran, Jakie Debow, PA-C 05/24/17 2015    Derwood KaplanNanavati, Ankit, MD 05/25/17 1601

## 2017-06-05 DIAGNOSIS — H44512 Absolute glaucoma, left eye: Secondary | ICD-10-CM | POA: Diagnosis not present

## 2017-06-05 DIAGNOSIS — H401112 Primary open-angle glaucoma, right eye, moderate stage: Secondary | ICD-10-CM | POA: Diagnosis not present

## 2017-06-11 ENCOUNTER — Encounter (HOSPITAL_COMMUNITY): Payer: Self-pay

## 2017-06-11 ENCOUNTER — Other Ambulatory Visit: Payer: Self-pay

## 2017-06-11 ENCOUNTER — Emergency Department (HOSPITAL_COMMUNITY)
Admission: EM | Admit: 2017-06-11 | Discharge: 2017-06-11 | Disposition: A | Discharge status: Home / Self Care | Payer: No Typology Code available for payment source | Attending: Emergency Medicine | Admitting: Emergency Medicine

## 2017-06-11 DIAGNOSIS — M25561 Pain in right knee: Secondary | ICD-10-CM | POA: Diagnosis not present

## 2017-06-11 DIAGNOSIS — Z79899 Other long term (current) drug therapy: Secondary | ICD-10-CM | POA: Insufficient documentation

## 2017-06-11 DIAGNOSIS — W1830XD Fall on same level, unspecified, subsequent encounter: Secondary | ICD-10-CM | POA: Insufficient documentation

## 2017-06-11 DIAGNOSIS — R600 Localized edema: Secondary | ICD-10-CM | POA: Diagnosis not present

## 2017-06-11 DIAGNOSIS — M25562 Pain in left knee: Secondary | ICD-10-CM | POA: Insufficient documentation

## 2017-06-11 DIAGNOSIS — M25461 Effusion, right knee: Secondary | ICD-10-CM | POA: Diagnosis not present

## 2017-06-11 DIAGNOSIS — W19XXXD Unspecified fall, subsequent encounter: Secondary | ICD-10-CM

## 2017-06-11 NOTE — ED Provider Notes (Signed)
[facility]-EMERGENCY DEPT Provider Note   CSN: [id number] Arrival date & time: 06/11/17  1800     History   Chief Complaint Chief Complaint  Patient presents with  . Knee Pain  . Back Pain    HPI Jessica [name] is a 62 y.o. female.  Patient status post a fall at work on January 11.  Patient hit the concrete and hurt her knee.  Patient tripped over a box and landed on her right knee.  Patient with swelling to that knee at that time.  No other injuries.  Patient also did have some complaint of pain to her left knee.  Patient discharged with follow-up with orthopedics.  Has seen Dr. Madelon Lips.  They are planning an MRI.  Patient is trying to get clearance for the MRI because she had eye surgery down in [place].  MRI has been ordered just not completed yet.  Patient with persistent right knee pain and left knee pain.  Still has swelling to the right knee.  Sounds as if orthopedics was concerned about ligamental injury and that is why they are doing the MRI of the right knee.  Patient has hydrocodone for pain.      Past Medical History:  Diagnosis Date  . Glaucoma   . Hypoglycemia     There are no active problems to display for this patient.   Past Surgical History:  Procedure Laterality Date  . ABDOMINAL HYSTERECTOMY    . CESAREAN SECTION    . EYE SURGERY    . TONSILLECTOMY      OB History    Gravida Para Term Preterm AB Living             2   SAB TAB Ectopic Multiple Live Births                   Home Medications    Prior to Admission medications   Medication Sig Start Date End Date Taking? Authorizing Provider  albuterol (PROVENTIL HFA;VENTOLIN HFA) 108 (90 Base) MCG/ACT inhaler Inhale 1 puff into the lungs every 6 (six) hours as needed for wheezing or shortness of breath.   Yes [provider]  Cholecalciferol (VITAMIN D3) 5000 units TABS Take 5,000 Units by mouth daily.   Yes [provider]  Co-Enzyme Q-10 100 MG CAPS Take  100 mg by mouth daily.   Yes [provider]  cyclobenzaprine (FLEXERIL) 5 MG tablet Take 1 tablet (5 mg total) by mouth 2 (two) times daily as needed for muscle spasms. 05/24/17  Yes Fayrene Helper, PA-C  diclofenac sodium (VOLTAREN) 1 % GEL Apply 2-4 g topically 4 (four) times daily. 06/06/17  Yes [provider]  docusate sodium (COLACE) 100 MG capsule Take 100 mg by mouth 2 (two) times daily.   Yes [provider]  escitalopram (LEXAPRO) 5 MG tablet Take 5-10 mg by mouth daily.   Yes [provider]  fluticasone (FLONASE) 50 MCG/ACT nasal spray Place 2 sprays into both nostrils daily.   Yes [provider]  HYDROcodone-acetaminophen (NORCO/VICODIN) 5-325 MG tablet Take 1 tablet by mouth every 6 (six) hours as needed for moderate pain.   Yes [provider]  latanoprost (XALATAN) 0.005 % ophthalmic solution Place 1 drop into the right eye at bedtime.  06/14/13  Yes [provider]  loratadine (CLARITIN) 10 MG tablet Take 10 mg by mouth daily.   Yes [provider]  Magnesium Gluconate 500 (27 Mg) MG TABS Take 500  mg by mouth daily.   Yes [provider]  montelukast (SINGULAIR) 10 MG tablet Take 10 mg by mouth at bedtime.   Yes [provider]  Multiple Vitamins-Minerals (MULTIVITAMIN WOMEN PO) Take 1 tablet by mouth daily.   Yes [provider]  naproxen sodium (ALEVE) 220 MG tablet Take 220 mg by mouth daily as needed.   Yes [provider]  Omega-3 Fatty Acids (FISH OIL) 1200 MG CAPS Take 1,200 mg by mouth daily.   Yes [provider]  omeprazole (PRILOSEC) 40 MG capsule Take 40 mg by mouth daily as needed (heart burn).    Yes [provider]  Polyethyl Glycol-Propyl Glycol 0.4-0.3 % SOLN Place 1 drop into both eyes daily.   Yes [provider]  Thiamine HCl (VITAMIN B-1) 250 MG tablet Take 250 mg by mouth 2 (two) times daily.   Yes [provider]     Family History Family History  Problem Relation Age of Onset  . Cancer Mother   . Hypertension Father   . Diabetes Sister     Social History Social History   Tobacco Use  . Smoking status: Never Smoker  . Smokeless tobacco: Never Used  Substance Use Topics  . Alcohol use: No  . Drug use: No     Allergies   Motrin [ibuprofen]; Sulfa antibiotics; Beta adrenergic blockers; Contrast media [iodinated diagnostic agents]; Gluten meal; Macrobid [nitrofurantoin monohyd macro]; Oxycodone-acetaminophen; Oxycodone-aspirin; Ultram [tramadol]; Wheat bran; Sugar-protein-starch; and Yeast-related products   Review of Systems Review of Systems  Constitutional: Negative for fever.  HENT: Negative for congestion.   Eyes: Negative for redness.  Respiratory: Negative for shortness of breath.   Cardiovascular: Negative for chest pain.  Gastrointestinal: Negative for abdominal pain.  Musculoskeletal: Positive for joint swelling.  Skin: Negative for wound.  Neurological: Negative for syncope and headaches.  Hematological: Does not bruise/bleed easily.  Psychiatric/Behavioral: Negative for confusion.     Physical Exam Updated Vital Signs BP 108/68 (BP Location: Left Arm)   Pulse 74   Temp 97.9 F (36.6 C) (Oral)   Resp 14   Ht 1.613 m (5' 3.5")   Wt 81.2 kg (179 lb)   SpO2 98%   BMI 31.21 kg/m   Physical Exam  Constitutional: She is oriented to person, place, and time. She appears well-developed and well-nourished. No distress.  HENT:  Head: Normocephalic and atraumatic.  Eyes:  Left eye cornea clouded over.  No site in that eye.  Neck: Neck supple.  Cardiovascular: Normal rate.  Pulmonary/Chest: Effort normal and breath sounds normal.  Abdominal: Soft. Bowel sounds are normal. There is no tenderness.  Musculoskeletal: She exhibits edema and tenderness.  Swelling to the right knee.  Tenderness to palpation anteriorly.  No evidence of any significant effusion.  Less  swelling to the left knee.  Some bruising to both knees.  Distally no ankle or foot swelling.  Dorsalis pedis pulse intact.  Bilaterally.  Neurological: She is alert and oriented to person, place, and time.  Skin: No erythema.  Nursing note and vitals reviewed.    ED Treatments / Results  Labs (all labs ordered are listed, but only abnormal results are displayed) Labs Reviewed - No data to display  EKG  EKG Interpretation None       Radiology No results found.  Procedures Procedures (including critical care time)  Medications Ordered in ED Medications - No data to display   Initial Impression / Assessment and Plan / ED Course  I have reviewed the triage vital signs and the nursing notes.  Pertinent labs & imaging results that were available during my care of the patient were reviewed by me and considered in my medical decision making (see chart for details).     Patient's visit on the January 11 was reviewed along with the x-rays.  Not able to see orthopedics notes.  Will try knee immobilizer on the right double up on her pain medicine.  MRI is the right thing to be done to rule out ligamental injuries.  Patient stable for discharge home.  Final Clinical Impressions(s) / ED Diagnoses   Final diagnoses:  Acute pain of both knees  Fall, subsequent encounter    ED Discharge Orders    None       Vanetta Mulders, MD 06/11/17 2319

## 2017-06-11 NOTE — Discharge Instructions (Signed)
Wear the knee immobilizer on the right knee for comfort.  It is okay to take 2 of your hydrocodone pain medicine.  Can take 2 every 6 hours.  Follow-up for your MRI as scheduled by orthopedics.  Return for any new or worse symptoms.

## 2017-06-11 NOTE — ED Notes (Signed)
Pt took Hydrocodone at [facility]

## 2017-06-11 NOTE — ED Triage Notes (Signed)
Patient c/o bilateral knee pain R>L and bilateral low back pain that radiates down both legs. Patient states she fell at work on 1/11/9. Patient states she is waiting to have an MRI that is scheduled for next week.

## 2017-08-07 DIAGNOSIS — H401112 Primary open-angle glaucoma, right eye, moderate stage: Secondary | ICD-10-CM | POA: Diagnosis not present

## 2017-08-21 DIAGNOSIS — S83203S Other tear of unspecified meniscus, current injury, right knee, sequela: Secondary | ICD-10-CM | POA: Diagnosis not present

## 2017-08-21 DIAGNOSIS — M170 Bilateral primary osteoarthritis of knee: Secondary | ICD-10-CM | POA: Diagnosis not present

## 2017-08-21 DIAGNOSIS — R200 Anesthesia of skin: Secondary | ICD-10-CM | POA: Diagnosis not present

## 2017-08-21 DIAGNOSIS — Z6831 Body mass index (BMI) 31.0-31.9, adult: Secondary | ICD-10-CM | POA: Diagnosis not present

## 2017-08-22 ENCOUNTER — Encounter: Payer: Self-pay | Admitting: Neurology

## 2017-09-06 ENCOUNTER — Encounter: Payer: Self-pay | Admitting: Neurology

## 2017-10-26 DIAGNOSIS — Z79899 Other long term (current) drug therapy: Secondary | ICD-10-CM | POA: Diagnosis not present

## 2017-10-26 DIAGNOSIS — R202 Paresthesia of skin: Secondary | ICD-10-CM | POA: Diagnosis not present

## 2017-10-26 DIAGNOSIS — M25561 Pain in right knee: Secondary | ICD-10-CM | POA: Diagnosis not present

## 2017-10-26 DIAGNOSIS — R0789 Other chest pain: Secondary | ICD-10-CM | POA: Diagnosis not present

## 2017-10-26 DIAGNOSIS — M79605 Pain in left leg: Secondary | ICD-10-CM | POA: Diagnosis not present

## 2017-10-26 DIAGNOSIS — M25562 Pain in left knee: Secondary | ICD-10-CM | POA: Diagnosis not present

## 2017-10-26 DIAGNOSIS — G8929 Other chronic pain: Secondary | ICD-10-CM | POA: Diagnosis not present

## 2017-10-26 DIAGNOSIS — Z87828 Personal history of other (healed) physical injury and trauma: Secondary | ICD-10-CM | POA: Diagnosis not present

## 2017-10-26 DIAGNOSIS — R51 Headache: Secondary | ICD-10-CM | POA: Diagnosis not present

## 2017-10-26 DIAGNOSIS — R079 Chest pain, unspecified: Secondary | ICD-10-CM | POA: Diagnosis not present

## 2017-10-26 DIAGNOSIS — M25462 Effusion, left knee: Secondary | ICD-10-CM | POA: Diagnosis not present

## 2017-10-26 DIAGNOSIS — M79604 Pain in right leg: Secondary | ICD-10-CM | POA: Diagnosis not present

## 2017-10-26 DIAGNOSIS — M25569 Pain in unspecified knee: Secondary | ICD-10-CM | POA: Diagnosis not present

## 2017-10-26 DIAGNOSIS — R200 Anesthesia of skin: Secondary | ICD-10-CM | POA: Diagnosis not present

## 2017-10-26 DIAGNOSIS — Z5181 Encounter for therapeutic drug level monitoring: Secondary | ICD-10-CM | POA: Diagnosis not present

## 2017-10-28 DIAGNOSIS — H401112 Primary open-angle glaucoma, right eye, moderate stage: Secondary | ICD-10-CM | POA: Diagnosis not present

## 2017-11-11 DIAGNOSIS — Z6831 Body mass index (BMI) 31.0-31.9, adult: Secondary | ICD-10-CM | POA: Diagnosis not present

## 2017-11-11 DIAGNOSIS — K219 Gastro-esophageal reflux disease without esophagitis: Secondary | ICD-10-CM | POA: Diagnosis not present

## 2017-11-11 DIAGNOSIS — R202 Paresthesia of skin: Secondary | ICD-10-CM | POA: Diagnosis not present

## 2017-11-11 DIAGNOSIS — R69 Illness, unspecified: Secondary | ICD-10-CM | POA: Diagnosis not present

## 2017-11-18 ENCOUNTER — Ambulatory Visit (INDEPENDENT_AMBULATORY_CARE_PROVIDER_SITE_OTHER): Payer: Medicare HMO | Admitting: Neurology

## 2017-11-18 ENCOUNTER — Telehealth: Payer: Self-pay

## 2017-11-18 ENCOUNTER — Encounter

## 2017-11-18 ENCOUNTER — Encounter: Payer: Self-pay | Admitting: Neurology

## 2017-11-18 VITALS — BP 98/62 | HR 70 | Ht 63.0 in | Wt 180.0 lb

## 2017-11-18 DIAGNOSIS — E538 Deficiency of other specified B group vitamins: Secondary | ICD-10-CM

## 2017-11-18 DIAGNOSIS — R202 Paresthesia of skin: Secondary | ICD-10-CM

## 2017-11-18 DIAGNOSIS — M79602 Pain in left arm: Secondary | ICD-10-CM

## 2017-11-18 DIAGNOSIS — M79601 Pain in right arm: Secondary | ICD-10-CM | POA: Diagnosis not present

## 2017-11-18 DIAGNOSIS — R200 Anesthesia of skin: Secondary | ICD-10-CM

## 2017-11-18 NOTE — Addendum Note (Signed)
Addended by: Dorthy CoolerBURNS, SANDRA J on: 11/18/2017 06:08 PM   Modules accepted: Orders

## 2017-11-18 NOTE — Telephone Encounter (Signed)
Called Pt, LMOVM advising her Dr Everlena CooperJaffe would like to additionally check a B12 and TSH. I advised her she will need to check in at our office first, and that there is not any lab coverage on 7/11-Thurs.
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
## 2017-11-18 NOTE — Patient Instructions (Addendum)
1.  We will check MRI of brain, cervical and lumbar spine.  Further recommendations, such as nerve conduction study, pending results of these tests. 2.  Continue gabapentin for now.  [facility] and they will call you directly to schedule your appt. They are located at [address] If you need to contact them directly, or have not heard from them within 5 days, please call:  [phone].

## 2017-11-20 ENCOUNTER — Other Ambulatory Visit (INDEPENDENT_AMBULATORY_CARE_PROVIDER_SITE_OTHER): Payer: Medicare HMO

## 2017-11-20 DIAGNOSIS — E538 Deficiency of other specified B group vitamins: Secondary | ICD-10-CM | POA: Diagnosis not present

## 2017-11-20 DIAGNOSIS — M79601 Pain in right arm: Secondary | ICD-10-CM

## 2017-11-20 DIAGNOSIS — R200 Anesthesia of skin: Secondary | ICD-10-CM | POA: Diagnosis not present

## 2017-11-20 DIAGNOSIS — R202 Paresthesia of skin: Secondary | ICD-10-CM

## 2017-11-20 DIAGNOSIS — M79602 Pain in left arm: Secondary | ICD-10-CM | POA: Diagnosis not present

## 2017-11-20 LAB — VITAMIN B12: Vitamin B-12: 633 pg/mL (ref 211–911)

## 2017-11-20 LAB — TSH: TSH: 2.43 u[IU]/mL (ref 0.35–4.50)

## 2017-11-26 ENCOUNTER — Telehealth: Payer: Self-pay

## 2017-11-26 NOTE — Telephone Encounter (Signed)
Called and advised Pt of lab results 

## 2017-11-26 NOTE — Telephone Encounter (Signed)
-----   Message from Drema DallasAdam R Jaffe, DO sent at 11/22/2017 12:41 PM EDT ----- Labs are normal

## 2017-12-09 ENCOUNTER — Ambulatory Visit
Admission: RE | Admit: 2017-12-09 | Discharge: 2017-12-09 | Disposition: A | Discharge status: Home / Self Care | Payer: Medicare HMO | Source: Ambulatory Visit | Attending: Neurology | Admitting: Neurology

## 2017-12-09 DIAGNOSIS — R202 Paresthesia of skin: Secondary | ICD-10-CM | POA: Diagnosis not present

## 2017-12-09 DIAGNOSIS — M79602 Pain in left arm: Principal | ICD-10-CM

## 2017-12-09 DIAGNOSIS — R200 Anesthesia of skin: Secondary | ICD-10-CM

## 2017-12-09 DIAGNOSIS — M79601 Pain in right arm: Secondary | ICD-10-CM

## 2017-12-09 DIAGNOSIS — M48061 Spinal stenosis, lumbar region without neurogenic claudication: Secondary | ICD-10-CM | POA: Diagnosis not present

## 2017-12-09 DIAGNOSIS — M542 Cervicalgia: Secondary | ICD-10-CM | POA: Diagnosis not present

## 2017-12-10 ENCOUNTER — Encounter

## 2017-12-10 ENCOUNTER — Ambulatory Visit: Payer: Self-pay | Admitting: Neurology

## 2017-12-10 DIAGNOSIS — H401112 Primary open-angle glaucoma, right eye, moderate stage: Secondary | ICD-10-CM | POA: Diagnosis not present

## 2017-12-12 ENCOUNTER — Encounter: Payer: Self-pay | Admitting: Neurology

## 2017-12-12 ENCOUNTER — Ambulatory Visit (INDEPENDENT_AMBULATORY_CARE_PROVIDER_SITE_OTHER): Payer: Medicare HMO | Admitting: Neurology

## 2017-12-12 VITALS — BP 110/80 | HR 72 | Ht 63.0 in | Wt 180.0 lb

## 2017-12-12 DIAGNOSIS — M79601 Pain in right arm: Secondary | ICD-10-CM | POA: Diagnosis not present

## 2017-12-12 DIAGNOSIS — M79602 Pain in left arm: Secondary | ICD-10-CM | POA: Diagnosis not present

## 2017-12-12 DIAGNOSIS — R202 Paresthesia of skin: Secondary | ICD-10-CM
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
## 2017-12-12 NOTE — Patient Instructions (Signed)
1.  Next step is nerve conduction study.  Further recommendations pending results.

## 2017-12-25 DIAGNOSIS — R202 Paresthesia of skin: Secondary | ICD-10-CM | POA: Diagnosis not present

## 2017-12-28 DIAGNOSIS — M25561 Pain in right knee: Secondary | ICD-10-CM | POA: Insufficient documentation

## 2017-12-31 ENCOUNTER — Ambulatory Visit (INDEPENDENT_AMBULATORY_CARE_PROVIDER_SITE_OTHER): Payer: Medicare HMO | Admitting: Neurology

## 2017-12-31 DIAGNOSIS — R202 Paresthesia of skin: Secondary | ICD-10-CM | POA: Diagnosis not present

## 2017-12-31 DIAGNOSIS — M79602 Pain in left arm: Secondary | ICD-10-CM

## 2017-12-31 DIAGNOSIS — M79601 Pain in right arm: Secondary | ICD-10-CM | POA: Diagnosis not present

## 2017-12-31 NOTE — Procedures (Signed)
[facility]  [address], [address]  [place], [place] [address] Tel: [phone] Fax:  [phone] Test Date:  12/31/2017  Patient: Jessica PlummerLydia [name] DOB: 09/06/1955 Physician: Nita Sickleonika Javis Abboud, DO  Sex: Female Height: 5\' 3"  Ref Phys: Shon MilletAdam Jaffe, D.O.  ID#: [id number] Temp: 32.1C Technician:    Patient Complaints: This is a 62 year-old female referred for evaluation of paresthesia involving the arms and legs.  NCV & EMG Findings: Extensive electrodiagnostic testing of the right upper and lower extremity shows:  1. All sensory responses including the right median, ulnar, mixed palmer, sural, and superficial peroneal sensory responses are within normal limits. 2. Right median, ulnar, and tibial motor responses are within normal limits.  Right peroneal motor response is mildly reduced at the extensor digitorum brevis (2.2 mV), and normal at the tibialis anterior. 3. Right tibial H reflex studies within normal limits. 4. There is no evidence of active or chronic motor axon loss changes affecting any of the tested muscles. Motor unit configuration and recruitment pattern is within normal limits.  Impression: This is a normal study of the right side. In particular, there is no evidence of a large fiber sensorimotor polyneuropathy or cervical/lumbosacral radiculopathy.   ___________________________ Nita Sickleonika Bonham Zingale, DO    Nerve Conduction Studies Anti Sensory Summary Table   Stim Site NR Peak (ms) Norm Peak (ms) P-T Amp (V) Norm P-T Amp  Right Median Anti Sensory (2nd Digit)  32.1C  Wrist    3.4 <3.8 47.0 >10  Right Sup Peroneal Anti Sensory (Ant Lat Mall)  32.1C  12 cm    2.6 <4.6 19.6 >3  Right Sural Anti Sensory (Lat Mall)  32.1C  Calf    3.3 <4.6 7.6 >3  Right Ulnar Anti Sensory (5th Digit)  32.1C  Wrist    3.2 <3.2 35.6 >5   Motor Summary Table   Stim Site NR Onset (ms) Norm Onset (ms) O-P Amp (mV) Norm O-P Amp Site1 Site2 Delta-0 (ms) Dist (cm) Vel (m/s) Norm Vel  (m/s)  Right Median Motor (Abd Poll Brev)  32.1C  Wrist    3.4 <4.0 7.2 >5 Elbow Wrist 5.1 29.0 57 >50  Elbow    8.5  6.7         Right Peroneal Motor (Ext Dig Brev)  32.1C  Ankle    3.4 <6.0 2.2 >2.5 B Fib Ankle 7.3 37.0 51 >40  B Fib    10.7  1.8  Poplt B Fib 1.2 8.0 67 >40  Poplt    11.9  1.8         Right Peroneal TA Motor (Tib Ant)  32.1C  Fib Head    2.4 <4.5 7.4 >3 Poplit Fib Head 1.2 9.0 75 >40  Poplit    3.6  6.5         Right Tibial Motor (Abd Hall Brev)  32.1C  Ankle    3.2 <6.0 8.0 >4 Knee Ankle 8.5 41.0 48 >40  Knee    11.7  4.6         Right Ulnar Motor (Abd Dig Minimi)  32.1C  Wrist    2.3 <3.1 9.9 >7 B Elbow Wrist 3.3 21.0 64 >50  B Elbow    5.6  9.2  A Elbow B Elbow 1.7 10.0 59 >50  A Elbow    7.3  8.8          Comparison Summary Table   Stim Site NR Peak (ms) Norm Peak (ms) P-T Amp (V)  Site1 Site2 Delta-P (ms) Norm Delta (ms)  Right Median/Ulnar Palm Comparison (Wrist - 8cm)  32.1C  Median Palm    2.0 <2.2 62.2 Median Palm Ulnar Palm 0.3   Ulnar Palm    1.7 <2.2 12.6       H Reflex Studies   NR H-Lat (ms) Lat Norm (ms) L-R H-Lat (ms)  Right Tibial (Gastroc)  32.1C     29.66 <35    EMG   Side Muscle Ins Act Fibs Psw Fasc Number Recrt Dur Dur. Amp Amp. Poly Poly. Comment  Right AntTibialis Nml Nml Nml Nml Nml Nml Nml Nml Nml Nml Nml Nml N/A  Right Gastroc Nml Nml Nml Nml Nml Nml Nml Nml Nml Nml Nml Nml N/A  Right Flex Dig Long Nml Nml Nml Nml Nml Nml Nml Nml Nml Nml Nml Nml N/A  Right RectFemoris Nml Nml Nml Nml Nml Nml Nml Nml Nml Nml Nml Nml N/A  Right GluteusMed Nml Nml Nml Nml Nml Nml Nml Nml Nml Nml Nml Nml N/A  Right 1stDorInt Nml Nml Nml Nml Nml Nml Nml Nml Nml Nml Nml Nml N/A  Right Ext Indicis Nml Nml Nml Nml Nml Nml Nml Nml Nml Nml Nml Nml N/A  Right PronatorTeres Nml Nml Nml Nml Nml Nml Nml Nml Nml Nml Nml Nml N/A  Right Biceps Nml Nml Nml Nml Nml Nml Nml Nml Nml Nml Nml Nml N/A  Right Triceps Nml Nml Nml Nml Nml Nml Nml Nml Nml Nml Nml  Nml N/A  Right Deltoid Nml Nml Nml Nml Nml Nml Nml Nml Nml Nml Nml Nml N/A      Waveforms:

## 2018-01-02 DIAGNOSIS — H44512 Absolute glaucoma, left eye: Secondary | ICD-10-CM | POA: Diagnosis not present

## 2018-01-02 DIAGNOSIS — H401112 Primary open-angle glaucoma, right eye, moderate stage: Secondary | ICD-10-CM | POA: Diagnosis not present

## 2018-01-06 ENCOUNTER — Telehealth: Payer: Self-pay | Admitting: Neurology

## 2018-01-06 NOTE — Telephone Encounter (Signed)
Patient calling to get results from EMG on 8.20.19

## 2018-01-07 ENCOUNTER — Telehealth: Payer: Self-pay

## 2018-01-07 NOTE — Telephone Encounter (Signed)
-----   Message from Drema DallasAdam R Jaffe, DO sent at 01/01/2018  7:20 AM EDT ----- Nerve study is normal.  Thus far, all testing is negative.  I don't have an explanation for the numbness and tingling.

## 2018-01-07 NOTE — Telephone Encounter (Signed)
Called Pt, LMOVM advising of EMG results and to call the office with any questions

## 2018-01-09 DIAGNOSIS — R202 Paresthesia of skin: Secondary | ICD-10-CM | POA: Diagnosis not present

## 2018-01-22 DIAGNOSIS — N941 Unspecified dyspareunia: Secondary | ICD-10-CM | POA: Diagnosis not present

## 2018-01-22 DIAGNOSIS — R1032 Left lower quadrant pain: Secondary | ICD-10-CM | POA: Diagnosis not present

## 2018-02-05 DIAGNOSIS — R102 Pelvic and perineal pain: Secondary | ICD-10-CM | POA: Diagnosis not present

## 2018-02-05 DIAGNOSIS — M62838 Other muscle spasm: Secondary | ICD-10-CM | POA: Diagnosis not present

## 2018-02-05 DIAGNOSIS — M6281 Muscle weakness (generalized): Secondary | ICD-10-CM | POA: Diagnosis not present

## 2018-02-05 DIAGNOSIS — K5900 Constipation, unspecified: Secondary | ICD-10-CM | POA: Diagnosis not present

## 2018-02-10 DIAGNOSIS — M6281 Muscle weakness (generalized): Secondary | ICD-10-CM | POA: Diagnosis not present

## 2018-02-10 DIAGNOSIS — R102 Pelvic and perineal pain: Secondary | ICD-10-CM | POA: Diagnosis not present

## 2018-02-10 DIAGNOSIS — K5900 Constipation, unspecified: Secondary | ICD-10-CM | POA: Diagnosis not present

## 2018-02-10 DIAGNOSIS — M62838 Other muscle spasm: Secondary | ICD-10-CM | POA: Diagnosis not present

## 2018-02-17 DIAGNOSIS — M62838 Other muscle spasm: Secondary | ICD-10-CM | POA: Diagnosis not present

## 2018-02-17 DIAGNOSIS — R102 Pelvic and perineal pain: Secondary | ICD-10-CM | POA: Diagnosis not present

## 2018-02-17 DIAGNOSIS — M6281 Muscle weakness (generalized): Secondary | ICD-10-CM | POA: Diagnosis not present

## 2018-02-17 DIAGNOSIS — K5900 Constipation, unspecified: Secondary | ICD-10-CM | POA: Diagnosis not present

## 2018-02-17 DIAGNOSIS — H5211 Myopia, right eye: Secondary | ICD-10-CM | POA: Diagnosis not present

## 2018-02-17 DIAGNOSIS — H524 Presbyopia: Secondary | ICD-10-CM | POA: Diagnosis not present

## 2018-02-17 DIAGNOSIS — H52201 Unspecified astigmatism, right eye: Secondary | ICD-10-CM | POA: Diagnosis not present

## 2018-02-24 DIAGNOSIS — M62838 Other muscle spasm: Secondary | ICD-10-CM | POA: Diagnosis not present

## 2018-02-24 DIAGNOSIS — M6281 Muscle weakness (generalized): Secondary | ICD-10-CM | POA: Diagnosis not present

## 2018-02-24 DIAGNOSIS — R102 Pelvic and perineal pain: Secondary | ICD-10-CM | POA: Diagnosis not present

## 2018-02-24 DIAGNOSIS — K5900 Constipation, unspecified: Secondary | ICD-10-CM | POA: Diagnosis not present

## 2018-03-03 DIAGNOSIS — M6281 Muscle weakness (generalized): Secondary | ICD-10-CM | POA: Diagnosis not present

## 2018-03-03 DIAGNOSIS — K5900 Constipation, unspecified: Secondary | ICD-10-CM | POA: Diagnosis not present

## 2018-03-03 DIAGNOSIS — R102 Pelvic and perineal pain: Secondary | ICD-10-CM | POA: Diagnosis not present

## 2018-03-03 DIAGNOSIS — M62838 Other muscle spasm: Secondary | ICD-10-CM | POA: Diagnosis not present

## 2018-03-10 DIAGNOSIS — R102 Pelvic and perineal pain: Secondary | ICD-10-CM | POA: Diagnosis not present

## 2018-03-10 DIAGNOSIS — M62838 Other muscle spasm: Secondary | ICD-10-CM | POA: Diagnosis not present

## 2018-03-10 DIAGNOSIS — M6281 Muscle weakness (generalized): Secondary | ICD-10-CM | POA: Diagnosis not present

## 2018-03-10 DIAGNOSIS — K5900 Constipation, unspecified: Secondary | ICD-10-CM | POA: Diagnosis not present

## 2018-03-11 DIAGNOSIS — N952 Postmenopausal atrophic vaginitis: Secondary | ICD-10-CM | POA: Diagnosis not present

## 2018-03-11 DIAGNOSIS — Z1211 Encounter for screening for malignant neoplasm of colon: Secondary | ICD-10-CM | POA: Diagnosis not present

## 2018-03-11 DIAGNOSIS — R300 Dysuria: Secondary | ICD-10-CM | POA: Diagnosis not present

## 2018-03-11 DIAGNOSIS — R61 Generalized hyperhidrosis: Secondary | ICD-10-CM | POA: Diagnosis not present

## 2018-03-11 DIAGNOSIS — Z1239 Encounter for other screening for malignant neoplasm of breast: Secondary | ICD-10-CM | POA: Diagnosis not present

## 2018-03-11 DIAGNOSIS — Z01419 Encounter for gynecological examination (general) (routine) without abnormal findings: Secondary | ICD-10-CM | POA: Diagnosis not present

## 2018-03-17 DIAGNOSIS — M6281 Muscle weakness (generalized): Secondary | ICD-10-CM | POA: Diagnosis not present

## 2018-03-17 DIAGNOSIS — R102 Pelvic and perineal pain: Secondary | ICD-10-CM | POA: Diagnosis not present

## 2018-03-17 DIAGNOSIS — M62838 Other muscle spasm: Secondary | ICD-10-CM | POA: Diagnosis not present

## 2018-03-17 DIAGNOSIS — K5900 Constipation, unspecified: Secondary | ICD-10-CM | POA: Diagnosis not present

## 2018-03-26 DIAGNOSIS — R102 Pelvic and perineal pain: Secondary | ICD-10-CM | POA: Diagnosis not present

## 2018-03-26 DIAGNOSIS — K5900 Constipation, unspecified: Secondary | ICD-10-CM | POA: Diagnosis not present

## 2018-03-26 DIAGNOSIS — M6281 Muscle weakness (generalized): Secondary | ICD-10-CM | POA: Diagnosis not present

## 2018-03-26 DIAGNOSIS — M62838 Other muscle spasm: Secondary | ICD-10-CM | POA: Diagnosis not present

## 2018-03-27 DIAGNOSIS — E559 Vitamin D deficiency, unspecified: Secondary | ICD-10-CM | POA: Diagnosis not present

## 2018-03-27 DIAGNOSIS — R5383 Other fatigue: Secondary | ICD-10-CM | POA: Diagnosis not present

## 2018-03-27 DIAGNOSIS — Z79899 Other long term (current) drug therapy: Secondary | ICD-10-CM | POA: Diagnosis not present

## 2018-03-31 DIAGNOSIS — Z1211 Encounter for screening for malignant neoplasm of colon: Secondary | ICD-10-CM | POA: Diagnosis not present

## 2018-03-31 DIAGNOSIS — Z6829 Body mass index (BMI) 29.0-29.9, adult: Secondary | ICD-10-CM | POA: Diagnosis not present

## 2018-03-31 DIAGNOSIS — Z0000 Encounter for general adult medical examination without abnormal findings: Secondary | ICD-10-CM | POA: Diagnosis not present

## 2018-04-03 DIAGNOSIS — Z90710 Acquired absence of both cervix and uterus: Secondary | ICD-10-CM | POA: Diagnosis not present

## 2018-04-03 DIAGNOSIS — M8589 Other specified disorders of bone density and structure, multiple sites: Secondary | ICD-10-CM | POA: Diagnosis not present

## 2018-04-03 DIAGNOSIS — N6312 Unspecified lump in the right breast, upper inner quadrant: Secondary | ICD-10-CM | POA: Diagnosis not present

## 2018-04-03 DIAGNOSIS — N644 Mastodynia: Secondary | ICD-10-CM | POA: Diagnosis not present

## 2018-04-03 DIAGNOSIS — Z780 Asymptomatic menopausal state: Secondary | ICD-10-CM | POA: Diagnosis not present

## 2018-04-07 DIAGNOSIS — M6281 Muscle weakness (generalized): Secondary | ICD-10-CM | POA: Diagnosis not present

## 2018-04-07 DIAGNOSIS — R102 Pelvic and perineal pain: Secondary | ICD-10-CM | POA: Diagnosis not present

## 2018-04-07 DIAGNOSIS — K5900 Constipation, unspecified: Secondary | ICD-10-CM | POA: Diagnosis not present

## 2018-04-07 DIAGNOSIS — M62838 Other muscle spasm: Secondary | ICD-10-CM | POA: Diagnosis not present

## 2018-04-14 DIAGNOSIS — R102 Pelvic and perineal pain: Secondary | ICD-10-CM | POA: Diagnosis not present

## 2018-04-14 DIAGNOSIS — M6281 Muscle weakness (generalized): Secondary | ICD-10-CM | POA: Diagnosis not present

## 2018-04-14 DIAGNOSIS — K5900 Constipation, unspecified: Secondary | ICD-10-CM | POA: Diagnosis not present

## 2018-04-14 DIAGNOSIS — M62838 Other muscle spasm: Secondary | ICD-10-CM | POA: Diagnosis not present

## 2018-05-12 DIAGNOSIS — E039 Hypothyroidism, unspecified: Secondary | ICD-10-CM | POA: Diagnosis not present

## 2018-05-12 DIAGNOSIS — E559 Vitamin D deficiency, unspecified: Secondary | ICD-10-CM | POA: Diagnosis not present

## 2018-07-08 ENCOUNTER — Encounter: Payer: Self-pay | Admitting: Internal Medicine

## 2018-07-08 ENCOUNTER — Ambulatory Visit: Attending: Internal Medicine | Admitting: Internal Medicine

## 2018-07-08 ENCOUNTER — Telehealth: Payer: Self-pay | Admitting: Internal Medicine

## 2018-07-08 VITALS — BP 105/74 | HR 84 | Temp 98.3°F | Resp 16 | Ht 63.5 in | Wt 169.4 lb

## 2018-07-08 DIAGNOSIS — H409 Unspecified glaucoma: Secondary | ICD-10-CM | POA: Insufficient documentation

## 2018-07-08 DIAGNOSIS — E785 Hyperlipidemia, unspecified: Secondary | ICD-10-CM | POA: Diagnosis not present

## 2018-07-08 DIAGNOSIS — K439 Ventral hernia without obstruction or gangrene: Secondary | ICD-10-CM | POA: Diagnosis not present

## 2018-07-08 DIAGNOSIS — Z9109 Other allergy status, other than to drugs and biological substances: Secondary | ICD-10-CM

## 2018-07-08 DIAGNOSIS — H6983 Other specified disorders of Eustachian tube, bilateral: Secondary | ICD-10-CM | POA: Insufficient documentation

## 2018-07-08 MED ORDER — MONTELUKAST SODIUM 10 MG PO TABS: 10.0000 mg | ORAL_TABLET | Freq: Every day | ORAL | 3 refills | Status: DC

## 2018-07-08 NOTE — Telephone Encounter (Signed)
New Message   Pt c/o medication issue:  1. Name of Medication:   montelukast (SINGULAIR) 10 MG tablet   2. What is your medication issue? Pt states that when she went to pick up her medication it says it has sodium and she wants to know if the prescription change. Please call

## 2018-07-08 NOTE — Patient Instructions (Signed)
Please sign a release for [place] to get your medical records from your previous provider.  Try to avoid any heavy lifting or straining to prevent the hernia from increasing in size.  You can use Sudafed over-the-counter as needed for the problems that you have with your ears.

## 2018-07-08 NOTE — Progress Notes (Signed)
Patient ID: Jessica [name], female    DOB: April 17, 1956  MRN: [id number]  CC: New Patient (Initial Visit)   Subjective: Jessica [name] is a 63 y.o. female who presents for new pt visit Her concerns today include:  Patient with history of glaucoma (on disability because of it, ? Legally blind in LT eye), OA of the knees, HL,   Previous PCP was Dr. Adriana ReamsEliz [name] with [facility] or [facility].  Changed because she no longer accepts her insurance.  Also looking for more natural alternatives.  Pt has glaucoma, GERD, IBS, allergies to dust, mold, perfumes  HM:  [facility], hysterectomy with part of cervix left in place 2002 for fibroids.  Last PAP was 2 yrs ago.  Will be due 02/2019, MMG and BMD 05/2018 with [facility] here in [place]. Did not get flu shot, reports that she did not tolerate it in the past  Family history, social history and surgical histories reviewed  Current Outpatient Medications on File Prior to Visit  Medication Sig Dispense Refill  . Cholecalciferol (VITAMIN D3) 5000 units TABS Take 5,000 Units by mouth daily.    Marland Kitchen. Co-Enzyme Q-10 100 MG CAPS Take 100 mg by mouth daily.    Marland Kitchen. docusate sodium (COLACE) 100 MG capsule Take 100 mg by mouth 2 (two) times daily.    . fluticasone (FLONASE) 50 MCG/ACT nasal spray Place 2 sprays into both nostrils daily.    Marland Kitchen. latanoprost (XALATAN) 0.005 % ophthalmic solution Place 1 drop into the right eye at bedtime.     Marland Kitchen. loratadine (CLARITIN) 10 MG tablet Take 10 mg by mouth daily.    . Magnesium Gluconate 500 (27 Mg) MG TABS Take 500 mg by mouth daily.    . Multiple Vitamins-Minerals (MULTIVITAMIN WOMEN PO) Take 1 tablet by mouth daily.    . naproxen sodium (ALEVE) 220 MG tablet Take 220 mg by mouth daily as needed.    . Omega-3 Fatty Acids (FISH OIL) 1200 MG CAPS Take 1,200 mg by mouth daily.    Bertram Gala. Polyethyl Glycol-Propyl Glycol 0.4-0.3 % SOLN Place 1 drop into both eyes daily.    . Thiamine HCl (VITAMIN B-1) 250 MG tablet Take 250 mg  by mouth 2 (two) times daily.    . timolol (TIMOPTIC) 0.5 % ophthalmic solution Place 1 drop into the right eye 2 (two) times daily.     No current facility-administered medications on file prior to visit.     Allergies  Allergen Reactions  . Motrin [Ibuprofen] Swelling    Tongue swells  . Sulfa Antibiotics Swelling    Tongue swells  . Beta Adrenergic Blockers Other (See Comments)    Eye infection (only with eye drops), shortness of breath  . Contrast Media [Iodinated Diagnostic Agents] Other (See Comments)    Only use non-ionic solutions per md  . Gluten Meal Other (See Comments)    Rectal itching, intestinal inflammation  . Macrobid [Nitrofurantoin Monohyd Macro] Itching  . Oxycodone-Acetaminophen Other (See Comments)    hallucinations  . Oxycodone-Aspirin Other (See Comments)    hallucinations  . Ultram [Tramadol] Other (See Comments)    hallucinations  . Wheat Bran Other (See Comments)    Rectal itching and burning inside of body (particularly on right side)  . Sugar-Protein-Starch Rash and Other (See Comments)    Foggy thoughts, blood sugar fluctuates excessively  . Yeast-Related Products Rash    Social History   Socioeconomic History  . Marital status: Married  Spouse name: Jessica [name]  . Number of children: 2  . Years of education: Not on file  . Highest education level: Bachelor's degree (e.g., BA, AB, BS)  Occupational History  . Occupation: disabled  Social Needs  . Financial resource strain: Not on file  . Food insecurity:    Worry: Not on file    Inability: Not on file  . Transportation needs:    Medical: Not on file    Non-medical: Not on file  Tobacco Use  . Smoking status: Never Smoker  . Smokeless tobacco: Never Used  Substance and Sexual Activity  . Alcohol use: No  . Drug use: No  . Sexual activity: Not on file  Lifestyle  . Physical activity:    Days per week: Not on file    Minutes per session: Not on file  . Stress: Not on file    Relationships  . Social connections:    Talks on phone: Not on file    Gets together: Not on file    Attends religious service: Not on file    Active member of club or organization: Not on file    Attends meetings of clubs or organizations: Not on file    Relationship status: Not on file  . Intimate partner violence:    Fear of current or ex partner: Not on file    Emotionally abused: Not on file    Physically abused: Not on file    Forced sexual activity: Not on file  Other Topics Concern  . Not on file  Social History Narrative   Patient is right-handed. She lives with her husband in a 1 story house. She does not exercise and only occasionally drinks caffeine.    Family History  Problem Relation Age of Onset  . Ovarian cancer Mother   . Heart disease Father   . Diabetes Sister   . Hypertension Sister     Past Surgical History:  Procedure Laterality Date  . ABDOMINAL HYSTERECTOMY    . CESAREAN SECTION    . EYE SURGERY    . SMALL INTESTINE SURGERY  2002  . TONSILLECTOMY    . TUBAL LIGATION      ROS: Review of Systems  HENT:       Pain in both ears intermittently for a few weeks.  Sometimes she feels as though she has water in her ear.  Eyes: Positive for visual disturbance.  Respiratory: Negative for chest tightness and shortness of breath.   Cardiovascular: Negative for chest pain and palpitations.  Gastrointestinal:       Reports that she has a knot in the mid abdomen above the umbilicus.  She has had it for 2 to 3 years.  She reports that her previous PCP told her that she needed to lose weight.  It is not bothersome to her and has not increased in size.  No nausea or vomiting with meals.  She sometimes has constipation for which she uses Colace.  Psychiatric/Behavioral: Negative for dysphoric mood. The patient is not nervous/anxious.   CT scan done in 2017 revealed upper mid abdominal ventral hernia containing fat and vessel measuring up to 3 cm Negative except  as stated above  PHYSICAL EXAM: BP 105/74   Pulse 84   Temp 98.3 F (36.8 C) (Oral)   Resp 16   Ht 5' 3.5" (1.613 m)   Wt 169 lb 6.4 oz (76.8 kg)   SpO2 99%   BMI 29.54 kg/m   Physical Exam  General appearance - alert, well appearing, overweight older African-American female and in no distress Mental status - normal mood, behavior, speech, dress, motor activity, and thought processes Eyes -left Iris is opaque.  Conjunctiva.  Extraocular movement intact Ears - bilateral TM's and external ear canals normal Nose - normal and patent, no erythema, discharge or polyps Mouth - mucous membranes moist, pharynx normal without lesions Neck - supple, no significant adenopathy Lymphatics - no palpable lymphadenopathy, no hepatosplenomegaly Chest - clear to auscultation, no wheezes, rales or rhonchi, symmetric air entry Heart - normal rate, regular rhythm, normal S1, S2, no murmurs, rubs, clicks or gallops Abdomen -normal bowel sounds, nondistended, soft.  Subtle soft subcutaneous mass felt midway between the umbilicus and epigastric area just to the left of the midline.  No bulging seen on Valsalva maneuver and coughing.  No hepatosplenomegaly Extremities -no lower extremity edema.  Spider veins on lower extremities   CMP Latest Ref Rng & Units 05/24/2017 08/21/2016  Glucose 65 - 99 mg/dL 80 825(P)  BUN 6 - 20 mg/dL 14 9  Creatinine 8.98 - 1.00 mg/dL 4.21 0.31  Sodium 281 - 145 mmol/L 144 137  Potassium 3.5 - 5.1 mmol/L 4.2 3.7  Chloride 101 - 111 mmol/L 107 105  CO2 22 - 32 mmol/L - 24  Calcium 8.9 - 10.3 mg/dL - 9.2   Lipid Panel  No results found for: CHOL, TRIG, HDL, CHOLHDL, VLDL, LDLCALC, LDLDIRECT  CBC    Component Value Date/Time   WBC 5.6 05/24/2017 1613   RBC 4.30 05/24/2017 1613   HGB 12.6 05/24/2017 1713   HCT 37.0 05/24/2017 1713   PLT 226 05/24/2017 1613   MCV 87.7 05/24/2017 1613   MCH 29.3 05/24/2017 1613   MCHC 33.4 05/24/2017 1613   RDW 14.0 05/24/2017 1613    LYMPHSABS 2.2 05/24/2017 1613   MONOABS 0.4 05/24/2017 1613   EOSABS 0.0 05/24/2017 1613   BASOSABS 0.0 05/24/2017 1613    ASSESSMENT AND PLAN: 1. Dysfunction of both eustachian tubes Advise use of Sudafed over-the-counter as needed  2. Ventral hernia without obstruction or gangrene I think what patient is referring to in her history is that she was diagnosed with having a ventral hernia.  She makes mention of the CAT scan that was done 2 to 3 years ago confirming this.  She denies any abdominal pain or discomfort from this.  I went over signs and symptoms that would suggest strangulation and would require urgent evaluation.  Advised to avoid heavy lifting  3. Glaucoma of both eyes, unspecified glaucoma type Followed by ophthalmology  4. Environmental allergies Patient requested refill on Singulair - montelukast (SINGULAIR) 10 MG tablet; Take 1 tablet (10 mg total) by mouth at bedtime.  Dispense: 90 tablet; Refill: 3  5. Hyperlipidemia, unspecified hyperlipidemia type - Lipid panel - Hepatic function panel  HM: She will be due for Pap smear in October of this year.  I have requested that she sign a release for [place] to get her records from her previous PCP and to bring copies of her last mammogram and bone mineral density reports.  Patient was given the opportunity to ask questions.  Patient verbalized understanding of the plan and was able to repeat key elements of the plan.   Orders Placed This Encounter  Procedures  . Lipid panel  . Hepatic function panel     Requested Prescriptions   Signed Prescriptions Disp Refills  . montelukast (SINGULAIR) 10 MG tablet 90 tablet 3  Sig: Take 1 tablet (10 mg total) by mouth at bedtime.    Return in about 2 months (around 09/06/2018).  Jonah Blue, MD, FACP

## 2018-07-08 NOTE — Telephone Encounter (Signed)
Look could you look into this for me please

## 2018-07-09 LAB — HEPATIC FUNCTION PANEL
ALT: 27 IU/L (ref 0–32)
AST: 39 IU/L (ref 0–40)
Albumin: 4.8 g/dL (ref 3.8–4.8)
Alkaline Phosphatase: 92 IU/L (ref 39–117)
Bilirubin Total: 0.4 mg/dL (ref 0.0–1.2)
Bilirubin, Direct: 0.14 mg/dL (ref 0.00–0.40)
Total Protein: 7.5 g/dL (ref 6.0–8.5)

## 2018-07-09 LAB — LIPID PANEL
Chol/HDL Ratio: 3.3 ratio (ref 0.0–4.4)
Cholesterol, Total: 230 mg/dL — ABNORMAL HIGH (ref 100–199)
HDL: 69 mg/dL (ref >39)
LDL Calculated: 144 mg/dL — ABNORMAL HIGH (ref 0–99)
Triglycerides: 85 mg/dL (ref 0–149)
VLDL Cholesterol Cal: 17 mg/dL (ref 5–40)

## 2018-07-09 NOTE — Telephone Encounter (Signed)
Spoke to pt. Explained to the patient that the sodium is a salt form and she was understanding. All questions addressed.

## 2018-07-14 ENCOUNTER — Telehealth: Payer: Self-pay | Admitting: Internal Medicine

## 2018-07-14 ENCOUNTER — Telehealth: Payer: Self-pay

## 2018-07-14 NOTE — Telephone Encounter (Signed)
Receive the above message from my CMA.  I called the patient and left a message on her voicemail asking her to give Jessica [name] the name of the medication that she was prescribed by the gastroenterologist that she is allergic to so that I can better advise her.  I asked that she call Jessica [name] back with this information.

## 2018-07-14 NOTE — Telephone Encounter (Signed)
-----   Message from Particia Lather, [place] sent at 07/14/2018  1:35 PM EST ----- Regarding: Colonoscopy Contacted pt to go over her lab results. Pt states that she was suppose to have her coloscopy done last Thursday but unfortunately she was unable to do the procedure. Pt states when she was mixing the drink she noticed it had some ingredients in it that she is allergic to so she was unable to drink it. Pt states she informed the GI doctor. Pt states that she was told to speak with her pcp in regards to another method for the colonoscopy.   Please f/u

## 2018-07-14 NOTE — Telephone Encounter (Signed)
Contacted pt to go over lab results pt is aware and doesn't have any questions or concerns 

## 2018-09-15 ENCOUNTER — Telehealth: Payer: Self-pay | Admitting: Internal Medicine

## 2018-09-15 NOTE — Telephone Encounter (Signed)
Will forward to pcp

## 2018-09-15 NOTE — Telephone Encounter (Signed)
Patient called stating she had a reaction to the sudafed. Patient states her tongue was swelled up and it felt heavy.Patient states this happened about a month ago.  Please follow up.   Patient declined to speak to triage nurse because nothing is going on now.

## 2018-09-17 ENCOUNTER — Ambulatory Visit: Payer: Medicare Other | Attending: Internal Medicine | Admitting: Internal Medicine

## 2018-09-17 ENCOUNTER — Ambulatory Visit: Payer: Self-pay | Admitting: Internal Medicine

## 2018-09-17 ENCOUNTER — Other Ambulatory Visit: Payer: Self-pay

## 2018-09-17 DIAGNOSIS — R0981 Nasal congestion: Secondary | ICD-10-CM

## 2018-09-17 DIAGNOSIS — Z889 Allergy status to unspecified drugs, medicaments and biological substances status: Secondary | ICD-10-CM

## 2018-09-17 DIAGNOSIS — Z1211 Encounter for screening for malignant neoplasm of colon: Secondary | ICD-10-CM

## 2018-09-17 NOTE — Progress Notes (Signed)
Pt states she stop the sudafed and her symptoms have gone away

## 2018-09-17 NOTE — Progress Notes (Signed)
Virtual Visit via Telephone Note Due to current restrictions/limitations of in-office visits due to the COVID-19 pandemic, this scheduled clinical appointment was converted to a telehealth visit  I connected with Jessica [name] on 09/17/18 at 4:01 p.m EDT by telephone and verified that I am speaking with the correct person using two identifiers. I am in my office.  The patient is at home.  Only the patient and myself participated in this encounter.  I discussed the limitations, risks, security and privacy concerns of performing an evaluation and management service by telephone and the availability of in person appointments. I also discussed with the patient that there may be a patient responsible charge related to this service. The patient expressed understanding and agreed to proceed.   History of Present Illness: Patient with history of glaucoma (on disability because of it, ? Legally blind in LT eye), OA of the knees, HL.  Last seen   On last visit, I recommended using Sudafed for eustachian tubes dysfunction.  Developed a feeling of heaviness of tongue and biting her tongue.  When she looked in the mirror, tongue looked "wide."  No issue since stopping the Sudafed.  Notice intermittent swelling of glands under her jaw when she gets sinus congestion or popping in air.  Just started taking Flonase and Claritin again with good results  HM:  Was scheduled to have c-scope at [facility].  Cancelled because the prep called Gavilyte-G has a flavoring to mix with it called Malti-dextran, she is allergic to Malti-dextran and other sugars.   Observations/Objective:  Results for orders placed or performed in visit on 07/08/18  Lipid panel  Result Value Ref Range   Cholesterol, Total 230 (H) 100 - 199 mg/dL   Triglycerides 85 0 - 149 mg/dL   HDL 69 >09 mg/dL   VLDL Cholesterol Cal 17 5 - 40 mg/dL   LDL Calculated 628 (H) 0 - 99 mg/dL   Chol/HDL Ratio 3.3 0.0 - 4.4 ratio  Hepatic function panel  Result  Value Ref Range   Total Protein 7.5 6.0 - 8.5 g/dL   Albumin 4.8 3.8 - 4.8 g/dL   Bilirubin Total 0.4 0.0 - 1.2 mg/dL   Bilirubin, Direct 3.66 0.00 - 0.40 mg/dL   Alkaline Phosphatase 92 39 - 117 IU/L   AST 39 0 - 40 IU/L   ALT 27 0 - 32 IU/L    Assessment and Plan: 1. Drug allergy Sudafed added to allergy list.  2. Sinus congestion Doing okay with Flonase and Claritin.  3. Screening for colon cancer We will ask our clinical pharmacist about whether other oral preps like GoLYTELY also has the flavoring in it that she is allergic to.  If we are not able to find an oral prep for her, I told her we may have to go with Cologuard test or the fit test.   Follow Up Instructions: F/u in 3 mths   I discussed the assessment and treatment plan with the patient. The patient was provided an opportunity to ask questions and all were answered. The patient agreed with the plan and demonstrated an understanding of the instructions.   The patient was advised to call back or seek an in-person evaluation if the symptoms worsen or if the condition fails to improve as anticipated.  I provided 13 minutes of non-face-to-face time during this encounter.   Jonah Blue, MD

## 2018-09-18 ENCOUNTER — Ambulatory Visit: Payer: Self-pay | Admitting: Internal Medicine

## 2018-09-19 ENCOUNTER — Telehealth: Payer: Self-pay | Admitting: Internal Medicine

## 2018-09-19 DIAGNOSIS — Z1211 Encounter for screening for malignant neoplasm of colon: Secondary | ICD-10-CM

## 2018-09-19 NOTE — Telephone Encounter (Signed)
-----   Message from Drucilla Chalet, RPH-CPP sent at 09/19/2018  1:19 PM EDT ----- Literature from the golytely FDA insert says that the golytely does not have dextran. The only Golytely prep that has maltodextrin is the pineapple flavored gloytely. If you send a rx, you can include directions in the prescription to not fill the pineapple flavored version.  ----- Message ----- From: Marcine Matar, MD Sent: 09/18/2018   6:38 PM EDT To: Drucilla Chalet, RPH-CPP  This patient was scheduled to have c-scope at [facility].  Cancelled because the prep called Gavilyte-G has a flavoring to mix with it called Malti-dextran, she is allergic to Malti-dextran and other sugars.  Would GoLytely have the same ingredient in it?  I am trying to find a bowel prep that she can safely take.

## 2018-09-19 NOTE — Telephone Encounter (Signed)
Patients call returned.  Patient identified by name and date of birth.  Patient given information concerning colonoscopy prep.  States procedure was scheduled for [facility] but patient is unaware if they had opened up yet.  States if they are not could Dr. Had a recommendation for specialist here at [facility].

## 2018-09-19 NOTE — Telephone Encounter (Signed)
Patient called back in regards to missed call. Please follow up.  °

## 2018-09-19 NOTE — Telephone Encounter (Signed)
Left voicemail for patient to return call.

## 2018-09-30 ENCOUNTER — Other Ambulatory Visit: Payer: Self-pay | Admitting: Pharmacist

## 2018-09-30 DIAGNOSIS — Z9109 Other allergy status, other than to drugs and biological substances: Secondary | ICD-10-CM

## 2018-09-30 MED ORDER — MONTELUKAST SODIUM 10 MG PO TABS: 10.0000 mg | ORAL_TABLET | Freq: Every day | ORAL | 0 refills | Status: DC

## 2018-11-05 DIAGNOSIS — H402213 Chronic angle-closure glaucoma, right eye, severe stage: Secondary | ICD-10-CM | POA: Insufficient documentation

## 2018-11-13 ENCOUNTER — Ambulatory Visit: Payer: Medicare Other

## 2018-11-13 ENCOUNTER — Encounter: Payer: Self-pay | Admitting: Internal Medicine

## 2018-11-13 ENCOUNTER — Other Ambulatory Visit: Payer: Self-pay

## 2018-11-13 ENCOUNTER — Ambulatory Visit: Payer: Medicare Other | Attending: Internal Medicine | Admitting: Internal Medicine

## 2018-11-13 DIAGNOSIS — Z131 Encounter for screening for diabetes mellitus: Secondary | ICD-10-CM

## 2018-11-13 DIAGNOSIS — H538 Other visual disturbances: Secondary | ICD-10-CM | POA: Diagnosis not present

## 2018-11-13 DIAGNOSIS — R358 Other polyuria: Secondary | ICD-10-CM | POA: Diagnosis not present

## 2018-11-13 DIAGNOSIS — R3589 Other polyuria: Secondary | ICD-10-CM

## 2018-11-13 NOTE — Progress Notes (Signed)
Virtual Visit via Telephone Note Due to current restrictions/limitations of in-office visits due to the COVID-19 pandemic, this scheduled clinical appointment was converted to a telehealth visit  I connected with Jessica [name] on 11/13/18 at 2:55 p.m EDT by telephone and verified that I am speaking with the correct person using two identifiers. I am in my office.  The patient is at home.  Only the patient and myself participated in this encounter.  I discussed the limitations, risks, security and privacy concerns of performing an evaluation and management service by telephone and the availability of in person appointments. I also discussed with the patient that there may be a patient responsible charge related to this service. The patient expressed understanding and agreed to proceed.   History of Present Illness: Patient with history of glaucoma(on disability because of it, ? Legally blind in LT eye), OA of the knees, HL.  Last evaluated 09/17/2018   Pt c/o increase blurred vision since March.  Thinks it occurs more when she eats.  Saw her eye doctor recently and was told the pressure in the eyes was elevated and pt started on new drops.  She mentioned to the eye doctor that the vision seems to be blurry sometimes when she eats.  Told to be eval for DM (but he did not see any signs of DM on the eye) and told to hold off on taking Claritin.   -pt endorses frequent urination.  No significant wgh changes   Outpatient Encounter Medications as of 11/13/2018  Medication Sig Note  . Cholecalciferol (VITAMIN D3) 5000 units TABS Take 5,000 Units by mouth daily.   Marland Kitchen Co-Enzyme Q-10 100 MG CAPS Take 100 mg by mouth daily.   Marland Kitchen docusate sodium (COLACE) 100 MG capsule Take 100 mg by mouth 2 (two) times daily.   . fluticasone (FLONASE) 50 MCG/ACT nasal spray Place 2 sprays into both nostrils daily.   Marland Kitchen latanoprost (XALATAN) 0.005 % ophthalmic solution Place 1 drop into the right eye at bedtime.  12/31/2013: .  .  loratadine (CLARITIN) 10 MG tablet Take 10 mg by mouth daily.   . Magnesium Gluconate 500 (27 Mg) MG TABS Take 500 mg by mouth daily.   . montelukast (SINGULAIR) 10 MG tablet Take 1 tablet (10 mg total) by mouth at bedtime.   . Multiple Vitamins-Minerals (MULTIVITAMIN WOMEN PO) Take 1 tablet by mouth daily.   . naproxen sodium (ALEVE) 220 MG tablet Take 220 mg by mouth daily as needed.   . Omega-3 Fatty Acids (FISH OIL) 1200 MG CAPS Take 1,200 mg by mouth daily.   Vladimir Faster Glycol-Propyl Glycol 0.4-0.3 % SOLN Place 1 drop into both eyes daily.   . Thiamine HCl (VITAMIN B-1) 250 MG tablet Take 250 mg by mouth 2 (two) times daily.   . timolol (TIMOPTIC) 0.5 % ophthalmic solution Place 1 drop into the right eye 2 (two) times daily.    No facility-administered encounter medications on file as of 11/13/2018.     Observations/Objective: No direct observation done as this was a telephone encounter.  Assessment and Plan: 1. Blurred vision Patient will come to the lab to have a basic metabolic panel and N5A done.  Encourage healthy eating and regular exercise - Hemoglobin O1H - Basic Metabolic Panel  2. Polyuria - Hemoglobin Y8M - Basic Metabolic Panel  3. Diabetes mellitus screening - Hemoglobin V7Q - Basic Metabolic Panel   Follow Up Instructions: As previous scheduled.   I discussed the assessment and treatment plan  with the patient. The patient was provided an opportunity to ask questions and all were answered. The patient agreed with the plan and demonstrated an understanding of the instructions.   The patient was advised to call back or seek an in-person evaluation if the symptoms worsen or if the condition fails to improve as anticipated.  I provided 11 minutes of non-face-to-face time during this encounter.   Jonah Blueeborah Annlee Glandon, MD

## 2018-11-13 NOTE — Progress Notes (Signed)
Patient verified DOB Patient has not taken medication today. Patient has eaten today. Patient denies pain at this time. Patient noticed the blurred vision after any meal. Patient attributed it to pressure in her eyes but after a check patients pressure was up but not in the area of where she is having the concern. Eye doctor suspected DM may be causing some concerns.

## 2018-11-14 LAB — BASIC METABOLIC PANEL
BUN/Creatinine Ratio: 25 (ref 12–28)
BUN: 21 mg/dL (ref 8–27)
CO2: 29 mmol/L (ref 20–29)
Calcium: 9.3 mg/dL (ref 8.7–10.3)
Chloride: 103 mmol/L (ref 96–106)
Creatinine, Ser: 0.84 mg/dL (ref 0.57–1.00)
GFR calc Af Amer: 86 mL/min/{1.73_m2} (ref >59)
GFR calc non Af Amer: 75 mL/min/{1.73_m2} (ref >59)
Glucose: 79 mg/dL (ref 65–99)
Potassium: 5.1 mmol/L (ref 3.5–5.2)
Sodium: 144 mmol/L (ref 134–144)

## 2018-11-14 LAB — HEMOGLOBIN A1C
Est. average glucose Bld gHb Est-mCnc: 100 mg/dL
Hgb A1c MFr Bld: 5.1 % (ref 4.8–5.6)

## 2018-11-17 ENCOUNTER — Other Ambulatory Visit: Payer: Self-pay

## 2018-11-17 ENCOUNTER — Telehealth: Payer: Self-pay | Admitting: *Deleted

## 2018-11-17 ENCOUNTER — Ambulatory Visit: Payer: Medicare Other | Attending: Internal Medicine

## 2018-11-17 DIAGNOSIS — E785 Hyperlipidemia, unspecified: Secondary | ICD-10-CM

## 2018-11-17 DIAGNOSIS — Z131 Encounter for screening for diabetes mellitus: Secondary | ICD-10-CM

## 2018-11-17 NOTE — Telephone Encounter (Signed)
Lipid panel ordered.

## 2018-11-17 NOTE — Progress Notes (Signed)
Patient is aware and returned for a fasting lipid this morning.

## 2018-11-18 ENCOUNTER — Telehealth: Payer: Self-pay | Admitting: *Deleted

## 2018-11-18 LAB — LIPID PANEL
Chol/HDL Ratio: 2.7 ratio (ref 0.0–4.4)
Cholesterol, Total: 223 mg/dL — ABNORMAL HIGH (ref 100–199)
HDL: 84 mg/dL (ref >39)
LDL Calculated: 132 mg/dL — ABNORMAL HIGH (ref 0–99)
Triglycerides: 35 mg/dL (ref 0–149)
VLDL Cholesterol Cal: 7 mg/dL (ref 5–40)

## 2018-11-18 NOTE — Telephone Encounter (Signed)
-----   Message from Ladell Pier, MD sent at 11/18/2018  9:08 AM EDT ----- Let patient know that her cholesterol level has improved.  LDL cholesterol was 144 but is now 132.  The goal is to be less than 100.  Healthy eating habits and regular exercise will help her to get to goal.

## 2018-11-18 NOTE — Telephone Encounter (Signed)
Patient verified DOB Patient is aware of not being diabetic, no liver concerns.  Patient is aware of cholesterol improving to 132 from 144 and needing to be less than 100. Patient scheduled FU for 12/22/2018

## 2018-11-26 ENCOUNTER — Encounter: Payer: Self-pay | Admitting: Internal Medicine

## 2018-11-26 NOTE — Progress Notes (Signed)
Received MMG and BMD reports from [facility].   Last MMG 04/03/2018 was negative. BMD study done 04/03/2018 revealed osteopenia in lumbar spine -2.2 compared to -1.7 in 07/2012 and in the femoral necks -1.10 compared to -1.30 in 2014 (on RT).  She also had an abdominal US done in 2017 that revealed subxiphoid supraumbilical subcut soft tissue mass focus likely reflecting a ventral hernia containing fat.

## 2018-12-22 ENCOUNTER — Ambulatory Visit: Payer: Medicare Other | Admitting: Internal Medicine

## 2019-02-06 ENCOUNTER — Encounter: Payer: Self-pay | Admitting: Internal Medicine

## 2019-02-06 ENCOUNTER — Other Ambulatory Visit: Payer: Self-pay

## 2019-02-06 ENCOUNTER — Ambulatory Visit: Payer: Medicare Other | Attending: Internal Medicine | Admitting: Internal Medicine

## 2019-02-06 VITALS — BP 102/69 | HR 54 | Temp 98.8°F | Resp 18 | Ht 63.0 in | Wt 178.0 lb

## 2019-02-06 DIAGNOSIS — L659 Nonscarring hair loss, unspecified: Secondary | ICD-10-CM | POA: Diagnosis not present

## 2019-02-06 DIAGNOSIS — Z2821 Immunization not carried out because of patient refusal: Secondary | ICD-10-CM

## 2019-02-06 DIAGNOSIS — Z9189 Other specified personal risk factors, not elsewhere classified: Secondary | ICD-10-CM

## 2019-02-06 NOTE — Progress Notes (Signed)
Patient ID: Jessica [name], female    DOB: 1955-12-29  MRN: [id number]  CC: Hair loss  Subjective: Jessica [name] is a 63 y.o. female who presents for urgent care visit Her concerns today include:   Pt c/o hair loss.  Had weave in hairfor 3 mths during the COVID pandemic.  She normally would have it taken out in about 4 weeks but hair salon's were closed.  She was forced to take it out herself.  She feels she may have damaged her hair by clipping off more in some spots than others leaving bald spots at the front of the head.  She has a picture on her phone that she showed me.  She has significant alopecia across the front of the head and temple areas. -She has started wearing a removable wig. Does her own hair treatments when she washes her hair once a week.  Her hair is natural.    Felt a knot under RT mandable x 2-3 mths Had a root canal infecion on this side which the dentist was able to treat with a few courses of antibiotics. She needs to have more dental work done and request a referral to dentist.    Patient Active Problem List   Diagnosis Date Noted  . Dysfunction of both eustachian tubes 07/08/2018  . Ventral hernia without obstruction or gangrene 07/08/2018  . Glaucoma of both eyes 07/08/2018  . Environmental allergies 07/08/2018  . Hyperlipidemia 07/08/2018     Current Outpatient Medications on File Prior to Visit  Medication Sig Dispense Refill  . Cholecalciferol (VITAMIN D3) 5000 units TABS Take 5,000 Units by mouth daily.    Marland Kitchen Co-Enzyme Q-10 100 MG CAPS Take 100 mg by mouth daily.    Marland Kitchen docusate sodium (COLACE) 100 MG capsule Take 100 mg by mouth 2 (two) times daily.    . fluticasone (FLONASE) 50 MCG/ACT nasal spray Place 2 sprays into both nostrils daily.    Marland Kitchen latanoprost (XALATAN) 0.005 % ophthalmic solution Place 1 drop into the right eye at bedtime.     Marland Kitchen loratadine (CLARITIN) 10 MG tablet Take 10 mg by mouth daily.    . Magnesium Gluconate 500 (27 Mg) MG TABS Take 500  mg by mouth daily.    . montelukast (SINGULAIR) 10 MG tablet Take 1 tablet (10 mg total) by mouth at bedtime. 90 tablet 0  . Multiple Vitamins-Minerals (MULTIVITAMIN WOMEN PO) Take 1 tablet by mouth daily.    . naproxen sodium (ALEVE) 220 MG tablet Take 220 mg by mouth daily as needed.    . Omega-3 Fatty Acids (FISH OIL) 1200 MG CAPS Take 1,200 mg by mouth daily.    Bertram Gala Glycol-Propyl Glycol 0.4-0.3 % SOLN Place 1 drop into both eyes daily.    . Thiamine HCl (VITAMIN B-1) 250 MG tablet Take 250 mg by mouth 2 (two) times daily.    . timolol (TIMOPTIC) 0.5 % ophthalmic solution Place 1 drop into the right eye 2 (two) times daily.     No current facility-administered medications on file prior to visit.     Allergies  Allergen Reactions  . Motrin [Ibuprofen] Swelling    Tongue swells  . Sulfa Antibiotics Swelling    Tongue swells  . Beta Adrenergic Blockers Other (See Comments)    Eye infection (only with eye drops), shortness of breath  . Contrast Media [Iodinated Diagnostic Agents] Other (See Comments)    Only use non-ionic solutions per md  . Gluten Meal Other (  See Comments)    Rectal itching, intestinal inflammation  . Macrobid [Nitrofurantoin Monohyd Macro] Itching  . Oxycodone-Acetaminophen Other (See Comments)    hallucinations  . Oxycodone-Aspirin Other (See Comments)    hallucinations  . Sudafed [Pseudoephedrine Hcl]     ?swollen tongue  . Ultram [Tramadol] Other (See Comments)    hallucinations  . Wheat Bran Other (See Comments)    Rectal itching and burning inside of body (particularly on right side)  . Sugar-Protein-Starch Rash and Other (See Comments)    Foggy thoughts, blood sugar fluctuates excessively  . Yeast-Related Products Rash    Social History   Socioeconomic History  . Marital status: Married    Spouse name: Braulio Conte  . Number of children: 2  . Years of education: Not on file  . Highest education level: Bachelor's degree (e.g., BA, AB, BS)   Occupational History  . Occupation: disabled  Social Needs  . Financial resource strain: Not on file  . Food insecurity    Worry: Not on file    Inability: Not on file  . Transportation needs    Medical: Not on file    Non-medical: Not on file  Tobacco Use  . Smoking status: Never Smoker  . Smokeless tobacco: Never Used  Substance and Sexual Activity  . Alcohol use: No  . Drug use: No  . Sexual activity: Not on file  Lifestyle  . Physical activity    Days per week: Not on file    Minutes per session: Not on file  . Stress: Not on file  Relationships  . Social Herbalist on phone: Not on file    Gets together: Not on file    Attends religious service: Not on file    Active member of club or organization: Not on file    Attends meetings of clubs or organizations: Not on file    Relationship status: Not on file  . Intimate partner violence    Fear of current or ex partner: Not on file    Emotionally abused: Not on file    Physically abused: Not on file    Forced sexual activity: Not on file  Other Topics Concern  . Not on file  Social History Narrative   Patient is right-handed. She lives with her husband in a 1 story house. She does not exercise and only occasionally drinks caffeine.    Family History  Problem Relation Age of Onset  . Ovarian cancer Mother   . Heart disease Father   . Diabetes Sister   . Hypertension Sister     Past Surgical History:  Procedure Laterality Date  . ABDOMINAL HYSTERECTOMY    . CESAREAN SECTION    . EYE SURGERY    . SMALL INTESTINE SURGERY  2002  . TONSILLECTOMY    . TUBAL LIGATION      ROS: Review of Systems Negative except as stated above  PHYSICAL EXAM: BP 102/69 (BP Location: Left Arm, Patient Position: Sitting, Cuff Size: Normal)   Pulse (!) 54   Temp 98.8 F (37.1 C) (Oral)   Resp 18   Ht 5\' 3"  (1.6 m)   Wt 178 lb (80.7 kg)   SpO2 99%   BMI 31.53 kg/m   Physical Exam  General appearance - alert,  well appearing, and in no distress Mental status - normal mood, behavior, speech, dress, motor activity, and thought processes Mouth - mucous membranes moist, pharynx normal without lesions Neck - supple, no significant  adenopathy of the anterior or posterior cervical chain.  No adenopathy felt under the jaw.  No thyroid enlargement.  No thyroid nodules Chest - clear to auscultation, no wheezes, rales or rhonchi, symmetric air entry Heart - normal rate, regular rhythm, normal S1, S2, no murmurs, rubs, clicks or gallops Hair: Extensive areas of alopecia over the frontal scalp.  Compared to the patient that she has on her phone however she does appear to have some new growth of hair coming back in  CMP Latest Ref Rng & Units 11/13/2018 07/08/2018 05/24/2017  Glucose 65 - 99 mg/dL 79 - 80  BUN 8 - 27 mg/dL 21 - 14  Creatinine 1.610.57 - 1.00 mg/dL 0.960.84 - 0.450.70  Sodium 409134 - 144 mmol/L 144 - 144  Potassium 3.5 - 5.2 mmol/L 5.1 - 4.2  Chloride 96 - 106 mmol/L 103 - 107  CO2 20 - 29 mmol/L 29 - -  Calcium 8.7 - 10.3 mg/dL 9.3 - -  Total Protein 6.0 - 8.5 g/dL - 7.5 -  Total Bilirubin 0.0 - 1.2 mg/dL - 0.4 -  Alkaline Phos 39 - 117 IU/L - 92 -  AST 0 - 40 IU/L - 39 -  ALT 0 - 32 IU/L - 27 -   Lipid Panel     Component Value Date/Time   CHOL 223 (H) 11/17/2018 1137   TRIG 35 11/17/2018 1137   HDL 84 11/17/2018 1137   CHOLHDL 2.7 11/17/2018 1137   LDLCALC 132 (H) 11/17/2018 1137    CBC    Component Value Date/Time   WBC 5.6 05/24/2017 1613   RBC 4.30 05/24/2017 1613   HGB 12.6 05/24/2017 1713   HCT 37.0 05/24/2017 1713   PLT 226 05/24/2017 1613   MCV 87.7 05/24/2017 1613   MCH 29.3 05/24/2017 1613   MCHC 33.4 05/24/2017 1613   RDW 14.0 05/24/2017 1613   LYMPHSABS 2.2 05/24/2017 1613   MONOABS 0.4 05/24/2017 1613   EOSABS 0.0 05/24/2017 1613   BASOSABS 0.0 05/24/2017 1613    ASSESSMENT AND PLAN: 1. Hair loss -Encourage patient to do moisturizing and conditioning treatments to the  hair at least twice a month.  She will hold off on putting any more we have in the hair. - Ambulatory referral to Dermatology - TSH  2. Influenza vaccination declined   3. Need for dental care - Ambulatory referral to Dentistry  Of note patient patient was referred for colonoscopy this year.  She states that she was called and plans to reschedule it after she is had dental work done.   Patient was given the opportunity to ask questions.  Patient verbalized understanding of the plan and was able to repeat key elements of the plan.   Orders Placed This Encounter  Procedures  . TSH  . Ambulatory referral to Dermatology  . Ambulatory referral to Dentistry     Requested Prescriptions    No prescriptions requested or ordered in this encounter    Return in about 3 months (around 05/08/2019).  Jonah Blueeborah Reginaldo Hazard, MD, FACP

## 2019-02-07 LAB — TSH: TSH: 3.3 u[IU]/mL (ref 0.450–4.500)

## 2019-02-08 IMAGING — US US RENAL
1 series · 14 of 25 positions shown · non-contrast
Comparison: CT of the abdomen and pelvis on 03/06/2016

CLINICAL DATA: Recurrent urinary tract infection.

EXAM:
RENAL / URINARY TRACT ULTRASOUND COMPLETE

[Series 1: us renal · 0.20mm/px · 14 of 34 slices shown]
[im 1/34]
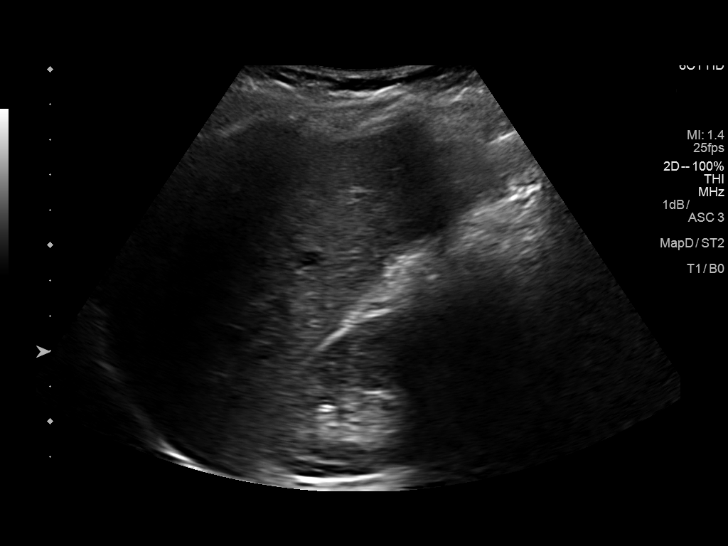
[im 3/34]
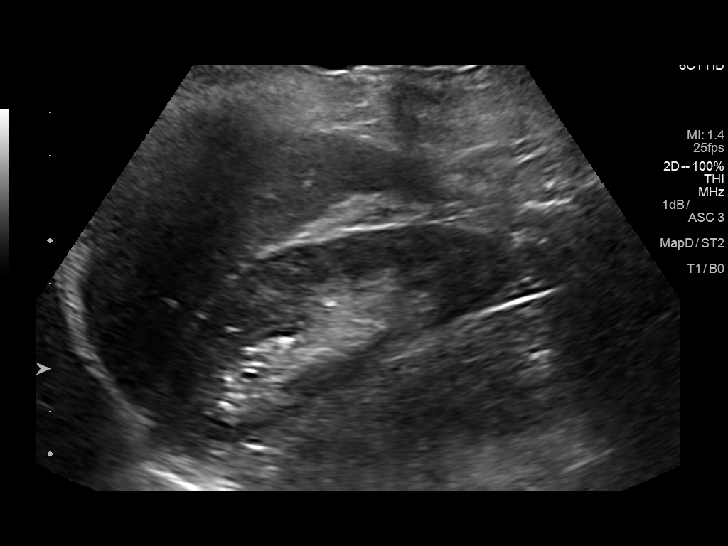
[im 6/34]
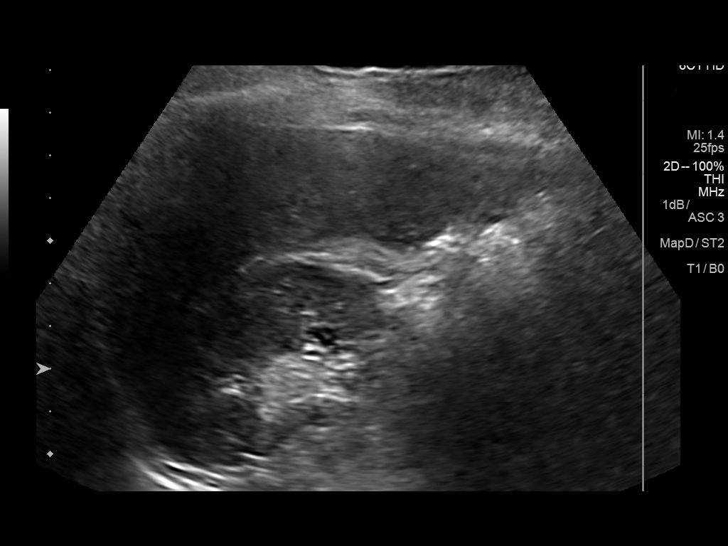
[im 9/34]
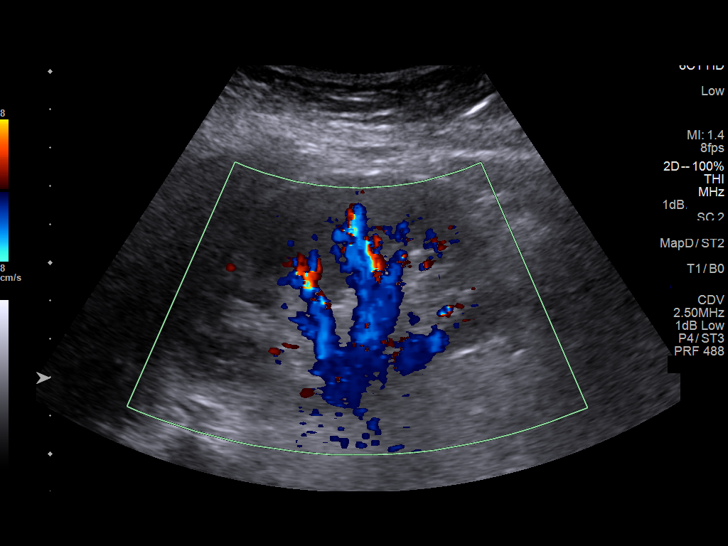
[im 12/34]
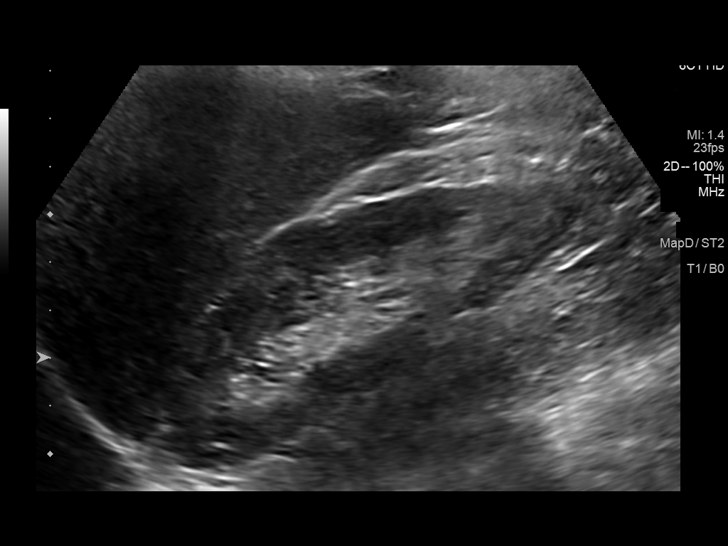
[im 13/34]
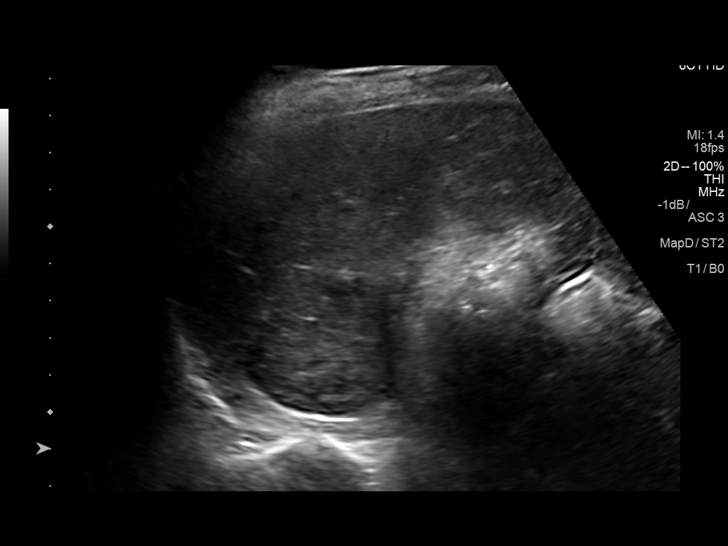
[im 16/34]
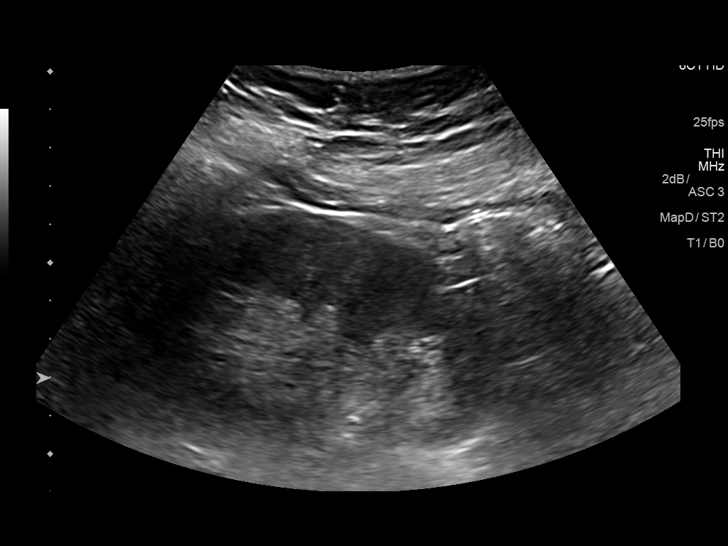
[im 18/34]
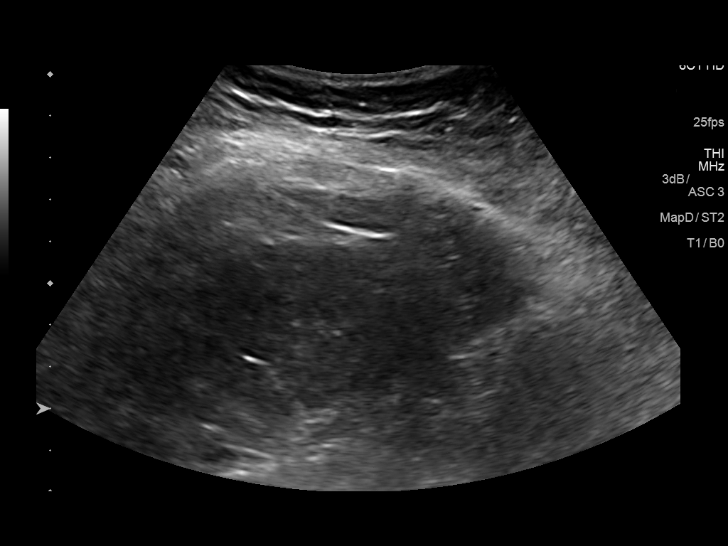
[im 21/34]
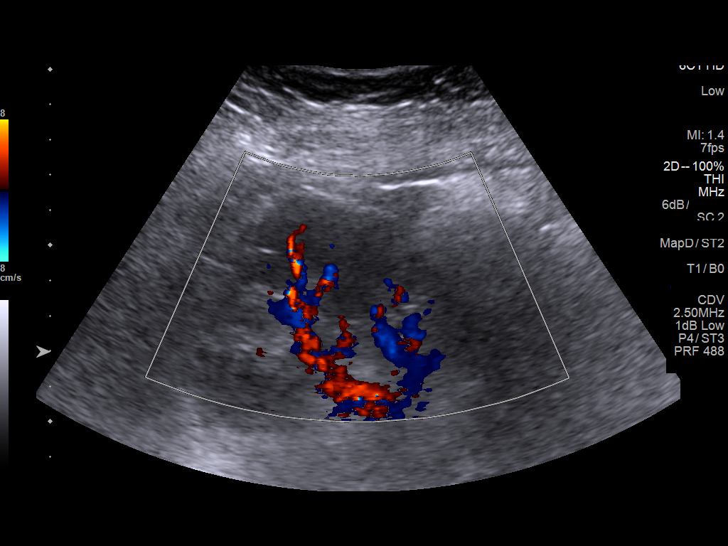
[im 23/34]
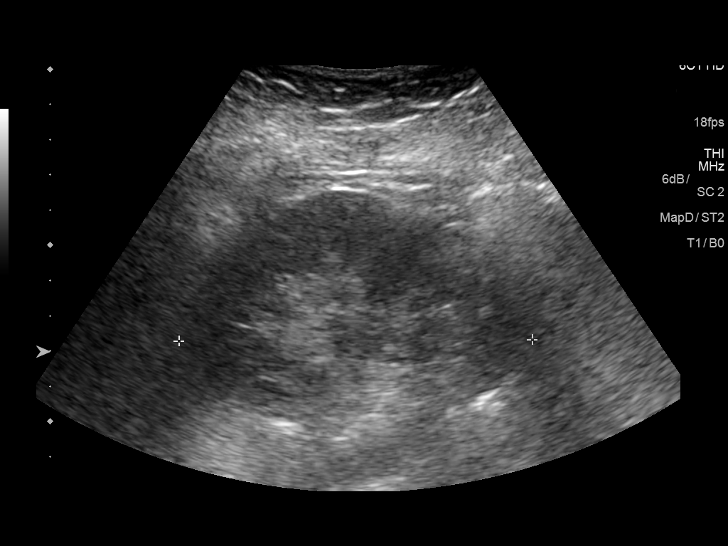
[im 25/34]
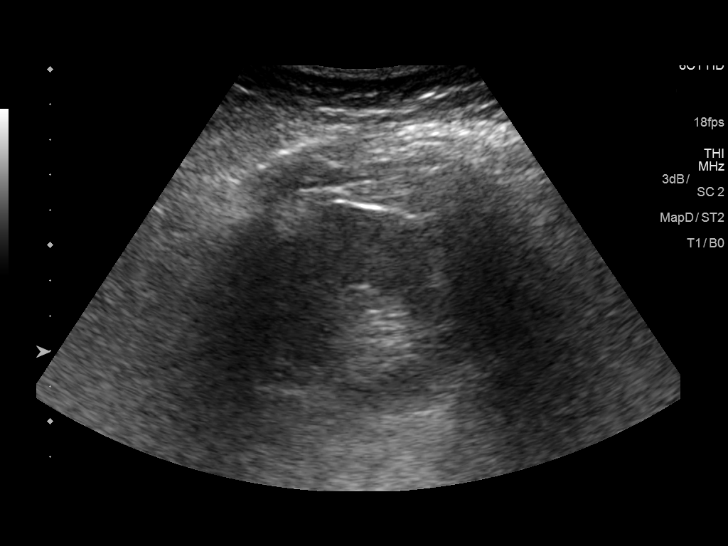
[im 28/34]
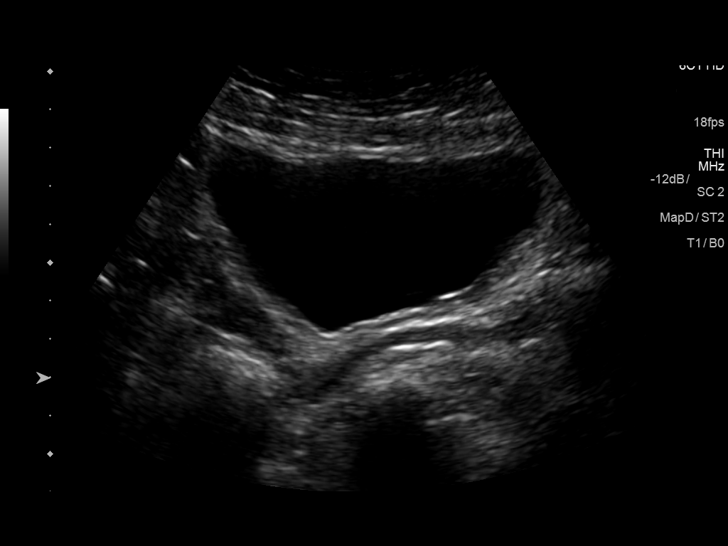
[im 31/34]
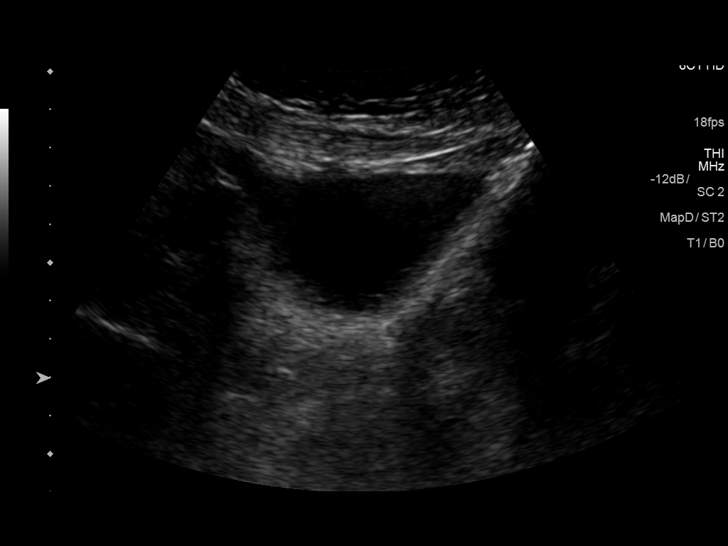
[im 34/34]
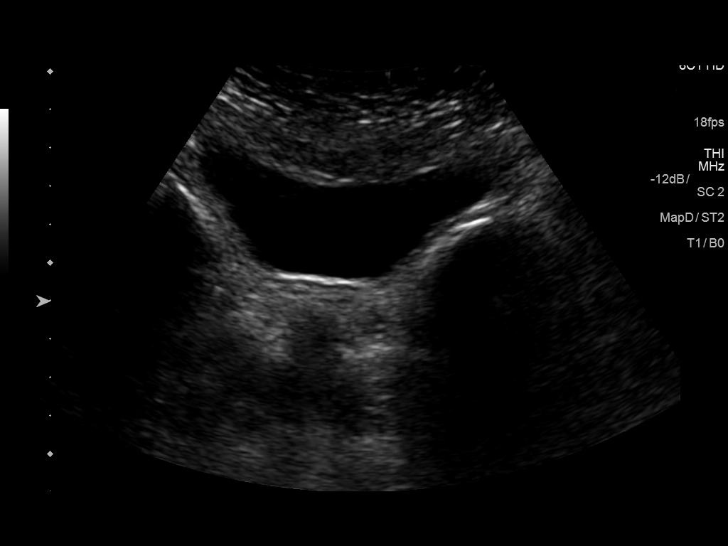

[14 of 25 positions shown; findings below may reference images not displayed]

FINDINGS: Right Kidney:

Length: 10.0 cm. Echogenicity within normal limits. No mass or
hydronephrosis visualized.

Left Kidney:

Length: 10.0 cm. Echogenicity within normal limits. No mass or
hydronephrosis visualized.

Bladder:

Appears normal for degree of bladder distention.
IMPRESSION: Normal evaluation of the kidneys and bladder.

## 2019-02-12 ENCOUNTER — Ambulatory Visit: Payer: Medicare Other | Admitting: Pharmacist

## 2019-02-17 ENCOUNTER — Telehealth: Payer: Self-pay | Admitting: *Deleted

## 2019-02-17 NOTE — Telephone Encounter (Signed)
-----   Message from Ladell Pier, MD sent at 02/07/2019  2:20 PM EDT ----- Thyroid level is nl.

## 2019-02-17 NOTE — Telephone Encounter (Signed)
Medical Assistant left message on patient's home and cell voicemail. Voicemail states to give a call back to [place] with [facility] at [phone]. Patient is aware of TSH level being normal

## 2019-04-07 ENCOUNTER — Encounter: Payer: Self-pay | Admitting: Internal Medicine

## 2019-04-15 ENCOUNTER — Telehealth: Payer: Self-pay | Admitting: Internal Medicine

## 2019-04-15 NOTE — Telephone Encounter (Signed)
Patient called requesting to speak with her nurse regarding her breast exam. Patient has some questions. Please f/u

## 2019-04-15 NOTE — Telephone Encounter (Signed)
Returned pt call and pt just want to make a FYI that when she gets a mammogram she has to get an ultrasound as well!

## 2019-04-28 DIAGNOSIS — H26491 Other secondary cataract, right eye: Secondary | ICD-10-CM | POA: Insufficient documentation

## 2019-04-28 DIAGNOSIS — H469 Unspecified optic neuritis: Secondary | ICD-10-CM | POA: Insufficient documentation

## 2019-05-16 ENCOUNTER — Telehealth: Payer: Self-pay | Admitting: Internal Medicine

## 2019-05-18 ENCOUNTER — Ambulatory Visit: Payer: Medicare Other | Admitting: Internal Medicine

## 2019-05-18 ENCOUNTER — Ambulatory Visit: Payer: Medicare Other | Attending: Internal Medicine | Admitting: Internal Medicine

## 2019-05-18 ENCOUNTER — Telehealth: Payer: Self-pay

## 2019-05-18 ENCOUNTER — Other Ambulatory Visit: Payer: Self-pay

## 2019-05-18 DIAGNOSIS — H409 Unspecified glaucoma: Secondary | ICD-10-CM | POA: Diagnosis not present

## 2019-05-18 DIAGNOSIS — R928 Other abnormal and inconclusive findings on diagnostic imaging of breast: Secondary | ICD-10-CM | POA: Diagnosis not present

## 2019-05-18 DIAGNOSIS — Z91018 Allergy to other foods: Secondary | ICD-10-CM | POA: Diagnosis not present

## 2019-05-18 NOTE — Telephone Encounter (Signed)
Contacted pt to schedule 4 month f/u pt didn't answer lvm asking pt to give a call back at her earliest convenience to schedule  *If pt calls back please if they would like their visit tele or in person.. Tele(afternoon) or in person(morning)*

## 2019-05-18 NOTE — Progress Notes (Signed)
Pt states she had eye surgery and she lost the majority of her eye sight

## 2019-05-18 NOTE — Progress Notes (Signed)
Virtual Visit via Telephone Note Due to current restrictions/limitations of in-office visits due to the COVID-19 pandemic, this scheduled clinical appointment was converted to a telehealth visit  I connected with Jessica [name] on 05/18/19 at 9:00 a.m EST by telephone and verified that I am speaking with the correct person using two identifiers. I am in my office.  The patient is at home.  Only the patient and myself participated in this encounter.  I discussed the limitations, risks, security and privacy concerns of performing an evaluation and management service by telephone and the availability of in person appointments. I also discussed with the patient that there may be a patient responsible charge related to this service. The patient expressed understanding and agreed to proceed.   History of Present Illness: Pt with hx of glaucoma, HL  Pt last seen 01/2019.  Since last visit she had acute worsening of glaucoma requiring surgery on RT eye.  Vision a little better.  Has additional f/u schedule  Reports she breaks out in rash when she eats certain foods especially with sugar in it. She also wonders if eating sugary foods affect her vision.  She notes that her vision gets blurry if she eats foods with high sugar content.  Screened for DM 11/2018 and BS and A1C was normal. Reports having allergy test many yrs ago and was found to have allergies to various things including dextran, dextrose, wheat. Reports "my food is limited" and more apprehensive due to vision changes.  No chronic diarrhea.  Does get bloating and constipation.   HM: had c-scope schedule same day that she had acute worsening of vision so she had to reschedule again. Had MMG - had mammogram recently with abnormal findings.  She states that she has to go back tomorrow for additional imaging.   Outpatient Encounter Medications as of 05/18/2019  Medication Sig Note  . Cholecalciferol (VITAMIN D3) 5000 units TABS Take 5,000 Units by mouth  daily.   Marland Kitchen Co-Enzyme Q-10 100 MG CAPS Take 100 mg by mouth daily.   Marland Kitchen docusate sodium (COLACE) 100 MG capsule Take 100 mg by mouth 2 (two) times daily.   . fluticasone (FLONASE) 50 MCG/ACT nasal spray Place 2 sprays into both nostrils daily.   Marland Kitchen latanoprost (XALATAN) 0.005 % ophthalmic solution Place 1 drop into the right eye at bedtime.  12/31/2013: .  . loratadine (CLARITIN) 10 MG tablet Take 10 mg by mouth daily.   . Magnesium Gluconate 500 (27 Mg) MG TABS Take 500 mg by mouth daily.   . montelukast (SINGULAIR) 10 MG tablet Take 1 tablet (10 mg total) by mouth at bedtime.   . Multiple Vitamins-Minerals (MULTIVITAMIN WOMEN PO) Take 1 tablet by mouth daily.   . naproxen sodium (ALEVE) 220 MG tablet Take 220 mg by mouth daily as needed.   . Omega-3 Fatty Acids (FISH OIL) 1200 MG CAPS Take 1,200 mg by mouth daily.   Vladimir Faster Glycol-Propyl Glycol 0.4-0.3 % SOLN Place 1 drop into both eyes daily.   . Thiamine HCl (VITAMIN B-1) 250 MG tablet Take 250 mg by mouth 2 (two) times daily.   . timolol (TIMOPTIC) 0.5 % ophthalmic solution Place 1 drop into the right eye 2 (two) times daily.    No facility-administered encounter medications on file as of 05/18/2019.    Observations/Objective: Results for orders placed or performed in visit on 02/06/19  TSH  Result Value Ref Range   TSH 3.300 0.450 - 4.500 uIU/mL   Lab Results  Component Value Date   CHOL 223 (H) 11/17/2018   HDL 84 11/17/2018   LDLCALC 132 (H) 11/17/2018   TRIG 35 11/17/2018   CHOLHDL 2.7 11/17/2018     Assessment and Plan: 1. Glaucoma of both eyes, unspecified glaucoma type Followed by ophthalmology.  2. Food allergy She reports a rash with certain foods including wheat.  We will screen her for celiac.  We will also refer to allergy and immunology for allergy testing - Tissue Transglutaminase, IGA  3. Abnormal mammogram She plans to keep the appointment tomorrow for additional imaging   Follow Up Instructions: 4  months   I discussed the assessment and treatment plan with the patient. The patient was provided an opportunity to ask questions and all were answered. The patient agreed with the plan and demonstrated an understanding of the instructions.   The patient was advised to call back or seek an in-person evaluation if the symptoms worsen or if the condition fails to improve as anticipated.  I provided 17 minutes of non-face-to-face time during this encounter.   Jonah Blue, MD

## 2019-05-19 ENCOUNTER — Encounter: Payer: Self-pay | Admitting: Internal Medicine

## 2019-05-19 DIAGNOSIS — H401113 Primary open-angle glaucoma, right eye, severe stage: Secondary | ICD-10-CM | POA: Diagnosis not present

## 2019-05-19 DIAGNOSIS — R928 Other abnormal and inconclusive findings on diagnostic imaging of breast: Secondary | ICD-10-CM | POA: Diagnosis not present

## 2019-05-19 DIAGNOSIS — Z7952 Long term (current) use of systemic steroids: Secondary | ICD-10-CM | POA: Diagnosis not present

## 2019-05-19 LAB — TISSUE TRANSGLUTAMINASE, IGA: Transglutaminase IgA: <2 U/mL (ref 0–3)

## 2019-05-20 NOTE — Progress Notes (Signed)
Let pt know that screen test for Celiac ds which is caused by allergy to gluten foods (ie wheat) is normal.  However, I have submitted the referral for her to see an allergist as discussed on recent televisit.

## 2019-05-22 ENCOUNTER — Ambulatory Visit: Payer: Self-pay | Admitting: Allergy

## 2019-05-22 ENCOUNTER — Telehealth: Payer: Self-pay

## 2019-05-22 NOTE — Telephone Encounter (Signed)
Contacted pt to go over lab results pt is aware and doesn't have any questions or concerns 

## 2019-05-26 ENCOUNTER — Encounter: Payer: Self-pay | Admitting: Internal Medicine

## 2019-05-26 NOTE — Progress Notes (Signed)
I got MMG report from [place].  Pt had additional views done. No MMG evidence of malignancy.  Routine MMG again in 1 yr.

## 2019-05-29 DIAGNOSIS — H1820 Unspecified corneal edema: Secondary | ICD-10-CM | POA: Diagnosis not present

## 2019-05-29 DIAGNOSIS — H44512 Absolute glaucoma, left eye: Secondary | ICD-10-CM | POA: Diagnosis not present

## 2019-05-29 DIAGNOSIS — H469 Unspecified optic neuritis: Secondary | ICD-10-CM | POA: Diagnosis not present

## 2019-05-29 DIAGNOSIS — Z961 Presence of intraocular lens: Secondary | ICD-10-CM | POA: Diagnosis not present

## 2019-05-29 DIAGNOSIS — H401113 Primary open-angle glaucoma, right eye, severe stage: Secondary | ICD-10-CM | POA: Diagnosis not present

## 2019-06-03 DIAGNOSIS — Z79899 Other long term (current) drug therapy: Secondary | ICD-10-CM | POA: Diagnosis not present

## 2019-06-03 DIAGNOSIS — H401113 Primary open-angle glaucoma, right eye, severe stage: Secondary | ICD-10-CM | POA: Diagnosis not present

## 2019-06-03 DIAGNOSIS — Z7952 Long term (current) use of systemic steroids: Secondary | ICD-10-CM | POA: Diagnosis not present

## 2019-06-12 ENCOUNTER — Telehealth: Payer: Self-pay | Admitting: Allergy

## 2019-06-12 ENCOUNTER — Encounter: Payer: Self-pay | Admitting: Allergy

## 2019-06-12 ENCOUNTER — Other Ambulatory Visit: Payer: Self-pay

## 2019-06-12 ENCOUNTER — Ambulatory Visit (INDEPENDENT_AMBULATORY_CARE_PROVIDER_SITE_OTHER): Payer: Medicare Other | Admitting: Allergy

## 2019-06-12 VITALS — BP 126/78 | HR 95 | Temp 98.7°F | Resp 19 | Ht 63.0 in | Wt 158.2 lb

## 2019-06-12 DIAGNOSIS — J3089 Other allergic rhinitis: Secondary | ICD-10-CM | POA: Diagnosis not present

## 2019-06-12 DIAGNOSIS — K9049 Malabsorption due to intolerance, not elsewhere classified: Secondary | ICD-10-CM

## 2019-06-12 DIAGNOSIS — T7819XD Other adverse food reactions, not elsewhere classified, subsequent encounter: Secondary | ICD-10-CM

## 2019-06-12 DIAGNOSIS — T781XXD Other adverse food reactions, not elsewhere classified, subsequent encounter: Secondary | ICD-10-CM

## 2019-06-12 MED ORDER — ALBUTEROL SULFATE HFA 108 (90 BASE) MCG/ACT IN AERS: 2.0000 | INHALATION_SPRAY | RESPIRATORY_TRACT | 1 refills | Status: DC | PRN

## 2019-06-12 MED ORDER — AZELASTINE-FLUTICASONE 137-50 MCG/ACT NA SUSP: 1.0000 | Freq: Two times a day (BID) | NASAL | 5 refills | Status: DC

## 2019-06-12 NOTE — Progress Notes (Signed)
New Patient Note  RE: Jessica [name] MRN: [id number] DOB: 05/06/1956 Date of Office Visit: 06/12/2019  Referring provider: Marcine Matar, MD Primary care provider: Marcine Matar, MD  Chief Complaint: Food allergy  History of present illness: Jessica [name] is a 64 y.o. female presenting today for consultation for food allergy.  She states she knows she has some food allergy but is not sure if she has developed more food allergy since she was last tested in the 90s.  The food she states she is aware she is allergic to thus far are wheat/gluten, yeast and sugar.  She is concerned that some of her food issues have worsened/caused her glaucoma. She had a trabeculectomy glaucoma surgery on 05/12/19.  At this time she has partial blindness.   Wheat/gluten - rectal itching and a burning sensation on her flank and abdominal bloating.   Symptoms occur the next day.  Yeast - develops rash on her hand that is a burning sensation.  Sugar - similar hand rash as yeast as well as feels her vision blurs and her eye pressure goes up.  She has discussed this with her ophthalmologist which per pt she has been told this is not likely a trigger.  However pt does believe that sugar ingestion raises her eye pressure.  This occurs same day as eating excess of sugar.  The following are food items that she feels she has developed since her last testing: Additives and preservatives - cause joint pains.   Brussel sprout and cauliflower - abdominal pain/cramping.  Symptoms occur same day as ingestion within 2 hours.   Tomato/acidic foods (lemon) - burning in throat, chest and stomach same day as eating. Dairy -reports intolerance with development of  abdominal cramping and bloating as well as constipation after ingestion.   Peanuts and tree nuts - burning flank pain and lower qaudrant abdominal pain shortly after eating.  She takes claritin and flonase daily for environmental allergy symptom control.  Uses  flonase 2 sprays each nostril daily which helps with congestion.  Has been on claritin for years.  Reports symptoms of itchy eyes, runny/stuffy nose.  A lot of dust exposure or if she has been traveling can cause chest tightness and mild wheezing.  Has used albuterol in the past but currently does not have any inhalers.  No previous diagnosis of asthma.  No history of eczema.      Review of systems: Review of Systems  Constitutional: Negative.   HENT: Negative.   Respiratory: Negative.   Cardiovascular: Negative.   Gastrointestinal: Negative.   Skin: Negative.   Neurological: Negative.     All other systems negative unless noted above in HPI  Past medical history: Past Medical History:  Diagnosis Date  . Glaucoma   . Hypoglycemia   . Optic neuropathy     Past surgical history: Past Surgical History:  Procedure Laterality Date  . ABDOMINAL HYSTERECTOMY    . CESAREAN SECTION    . EYE SURGERY    . SMALL INTESTINE SURGERY  2002  . TONSILLECTOMY    . TUBAL LIGATION      Family history:  Family History  Problem Relation Age of Onset  . Ovarian cancer Mother   . Heart disease Father   . Diabetes Sister   . Hypertension Sister     Social history: Lives in an apartment with carpeting in bedroom with heat pump for heating and central.  No pets in the home.  No concern for  water damage, mildew or roaches in the home.  No smoking history.  Not employed.     Medication List: Current Outpatient Medications  Medication Sig Dispense Refill  . Cholecalciferol (VITAMIN D3) 5000 units TABS Take 5,000 Units by mouth daily.    Marland Kitchen Co-Enzyme Q-10 100 MG CAPS Take 100 mg by mouth daily.    . fluticasone (FLONASE) 50 MCG/ACT nasal spray Place 2 sprays into both nostrils daily.    Marland Kitchen latanoprost (XALATAN) 0.005 % ophthalmic solution Place 1 drop into the right eye at bedtime.     Marland Kitchen loratadine (CLARITIN) 10 MG tablet Take 10 mg by mouth daily.    . Magnesium Gluconate 500 (27 Mg) MG TABS  Take 500 mg by mouth daily.    . montelukast (SINGULAIR) 10 MG tablet Take 1 tablet (10 mg total) by mouth at bedtime. 90 tablet 0  . Multiple Vitamins-Minerals (MULTIVITAMIN WOMEN PO) Take 1 tablet by mouth daily.    . naproxen sodium (ALEVE) 220 MG tablet Take 220 mg by mouth daily as needed.    . Omega-3 Fatty Acids (FISH OIL) 1200 MG CAPS Take 1,200 mg by mouth daily.    Bertram Gala Glycol-Propyl Glycol 0.4-0.3 % SOLN Place 1 drop into both eyes daily.    . Thiamine HCl (VITAMIN B-1) 250 MG tablet Take 250 mg by mouth 2 (two) times daily.    . timolol (TIMOPTIC) 0.5 % ophthalmic solution Place 1 drop into the right eye 2 (two) times daily.    Marland Kitchen albuterol (VENTOLIN HFA) 108 (90 Base) MCG/ACT inhaler Inhale 2 puffs into the lungs every 4 (four) hours as needed. 18 g 1  . Azelastine-Fluticasone 137-50 MCG/ACT SUSP Place 1 spray into the nose 2 (two) times daily. 23 g 5  . docusate sodium (COLACE) 100 MG capsule Take 100 mg by mouth 2 (two) times daily.     No current facility-administered medications for this visit.    Known medication allergies: Allergies  Allergen Reactions  . Motrin [Ibuprofen] Swelling    Tongue swells  . Sulfa Antibiotics Swelling    Tongue swells  . Beta Adrenergic Blockers Other (See Comments)    Eye infection (only with eye drops), shortness of breath  . Contrast Media [Iodinated Diagnostic Agents] Other (See Comments)    Only use non-ionic solutions per md  . Gluten Meal Other (See Comments)    Rectal itching, intestinal inflammation  . Macrobid [Nitrofurantoin Monohyd Macro] Itching  . Oxycodone-Acetaminophen Other (See Comments)    hallucinations  . Oxycodone-Aspirin Other (See Comments)    hallucinations  . Sudafed [Pseudoephedrine Hcl]     ?swollen tongue  . Ultram [Tramadol] Other (See Comments)    hallucinations  . Wheat Bran Other (See Comments)    Rectal itching and burning inside of body (particularly on right side)  . Sugar-Protein-Starch  Rash and Other (See Comments)    Foggy thoughts, blood sugar fluctuates excessively  . Yeast-Related Products Rash     Physical examination: Blood pressure 126/78, pulse 95, temperature 98.7 F (37.1 C), temperature source Temporal, resp. rate 19, height 5\' 3"  (1.6 m), weight 158 lb 3.2 oz (71.8 kg), SpO2 98 %.  General: Alert, interactive, in no acute distress, wearing dark glasses. HEENT: TMs pearly gray, turbinates minimally edematous without discharge, post-pharynx unremarkable. Neck: Supple without lymphadenopathy. Lungs: Clear to auscultation without wheezing, rhonchi or rales. {no increased work of breathing. CV: Normal S1, S2 without murmurs. Skin: Warm and dry, without lesions or rashes. Extremities:  No clubbing, cyanosis or edema. Neuro:   Grossly intact.  Diagnositics/Labs: None today  Assessment and plan:   Adverse food reaction  Food Intolerance  - will obtain serum IgE to foods discussed today that cause you to have symptoms including nuts, dairy, tomato, lemon, yeast, gluten  - continue avoidance of these foods until labs return.  If you have IgE to these foods will provide with an epinephrine device  - we discussed today IgE mediated food allergy versus food intolerance  - sugar, additive and preservatives are often intolerances.  We do not have any testing available at this time for additives or preservatives thus avoidance if possible is recommended.     - appears with the acidic foods that this may be triggering reflux symptoms  - recommend keeping a food journal to document foods and symptoms you develop with ingestion  Allergic rhinitis  - will obtain environmental allergy panel  - recommend changing Claritin to either Zyrtec 10mg , Allegra 180mg  or Xyzal 5mg  daily  - will have you try dymista 1 spray each nostril twice a day.  This is a combination nasal spray with Flonase + Astelin (nasal antihistamine).  This helps with both nasal congestion and drainage.    Hold your Flonase while using Dymista.    Follow-up 6 months or sooner if needed  I appreciate the opportunity to take part in Jessica [name]'s care. Please do not hesitate to contact me with questions.  Sincerely,   Prudy Feeler, MD Allergy/Immunology [facility]

## 2019-06-12 NOTE — Patient Instructions (Addendum)
Adverse food reaction   - will obtain serum IgE to foods discussed today that cause you to have symptoms including nuts, dairy, tomato, lemon, yeast, gluten  - continue avoidance of these foods until labs return.  If you have IgE to these foods will provide with an epinephrine device  - we discussed today IgE mediated food allergy versus food intolerance  - sugar, additive and preservatives are often intolerances.  We do not have any testing available at this time for additives or preservatives thus avoidance if possible is recommended  - recommend keeping a food journal to document foods and symptoms you develop with ingestion  Allergies  - will obtain environmental allergy panel  - recommend changing Claritin to either Zyrtec 10mg , Allegra 180mg  or Xyzal 5mg  daily  - will have you try dymista 1 spray each nostril twice a day.  This is a combination nasal spray with Flonase + Astelin (nasal antihistamine).  This helps with both nasal congestion and drainage.   Hold your Flonase while using Dymista.    Follow-up 6 months or sooner if needed

## 2019-06-12 NOTE — Telephone Encounter (Signed)
Call to patient to make her aware that medication was sent into the pharmacy. No further questions, pt verbalized understanding, call ended.

## 2019-06-12 NOTE — Telephone Encounter (Signed)
Pt called to see when her dymista would be called into her pharmacy.

## 2019-06-14 LAB — IGE+ALLERGENS ZONE 2(30)

## 2019-06-14 LAB — ALLERGENS(7)
Brazil Nut IgE: <0.1 kU/L
F020-IgE Almond: <0.1 kU/L
F202-IgE Cashew Nut: <0.1 kU/L
Hazelnut (Filbert) IgE: <0.1 kU/L
Peanut IgE: <0.1 kU/L
Pecan Nut IgE: <0.1 kU/L
Walnut IgE: <0.1 kU/L

## 2019-06-14 LAB — ALLERGEN,BRUSSEL SPRTS,RF217: Allergen Brussel Sprouts IgE: <0.1 kU/L

## 2019-06-14 LAB — ALLERGEN, LEMON, F208: Lemon: <0.1 kU/L

## 2019-06-14 LAB — ALLERGEN, BAKERS YEAST, F45: F045-IgE Yeast: <0.1 kU/L

## 2019-06-14 LAB — ALLERGEN, CAULIFLOWER, RF291: Allergen Cauliflower IgE: <0.1 kU/L

## 2019-06-14 LAB — ALLERGEN, TOMATO F25: Allergen Tomato, IgE: <0.1 kU/L

## 2019-06-14 LAB — ALLERGEN, WHEAT, F4: Wheat IgE: <0.1 kU/L

## 2019-06-14 LAB — ALLERGEN MILK: Milk IgE: <0.1 kU/L

## 2019-06-16 DIAGNOSIS — H469 Unspecified optic neuritis: Secondary | ICD-10-CM | POA: Diagnosis not present

## 2019-06-16 DIAGNOSIS — H53131 Sudden visual loss, right eye: Secondary | ICD-10-CM | POA: Diagnosis not present

## 2019-07-13 ENCOUNTER — Telehealth: Payer: Self-pay | Admitting: Internal Medicine

## 2019-07-13 NOTE — Telephone Encounter (Signed)
Pt stated she had eye surgery and part of the incision opened up so she is to go back in on the 07-21-19 to get re-stitch,,, requesting endo referral to eye pressure,,,also dietian referral,,,dermo rash has reoccurred and massive spread so needing dermo referral again

## 2019-07-14 NOTE — Telephone Encounter (Signed)
Will forward to pcp

## 2019-07-15 DIAGNOSIS — H401113 Primary open-angle glaucoma, right eye, severe stage: Secondary | ICD-10-CM | POA: Diagnosis not present

## 2019-07-15 DIAGNOSIS — Z01812 Encounter for preprocedural laboratory examination: Secondary | ICD-10-CM | POA: Diagnosis not present

## 2019-07-15 NOTE — Telephone Encounter (Signed)
Contacted pt to schedule a tele visit pt didn't answer left a detailed vm asking pt to give [place] a call to schedule

## 2019-07-17 ENCOUNTER — Ambulatory Visit: Payer: Medicare Other | Attending: Internal Medicine | Admitting: Internal Medicine

## 2019-07-17 ENCOUNTER — Other Ambulatory Visit: Payer: Self-pay

## 2019-07-17 DIAGNOSIS — E162 Hypoglycemia, unspecified: Secondary | ICD-10-CM | POA: Diagnosis not present

## 2019-07-17 DIAGNOSIS — R21 Rash and other nonspecific skin eruption: Secondary | ICD-10-CM | POA: Diagnosis not present

## 2019-07-17 DIAGNOSIS — E663 Overweight: Secondary | ICD-10-CM | POA: Diagnosis not present

## 2019-07-17 DIAGNOSIS — Z713 Dietary counseling and surveillance: Secondary | ICD-10-CM

## 2019-07-17 NOTE — Progress Notes (Signed)
Virtual Visit via Telephone Note Due to current restrictions/limitations of in-office visits due to the COVID-19 pandemic, this scheduled clinical appointment was converted to a telehealth visit  I connected with Jessica [name] on 07/17/19 at 12:26 by telephone and verified that I am speaking with the correct person using two identifiers. I am in my office.  The patient is at home.  Only the patient and myself participated in this encounter.  I discussed the limitations, risks, security and privacy concerns of performing an evaluation and management service by telephone and the availability of in person appointments. I also discussed with the patient that there may be a patient responsible charge related to this service. The patient expressed understanding and agreed to proceed.   History of Present Illness: Pt with hx of glaucoma, HL  C/o having rash on legs and buttock.  Started few wks ago No initiating factor.  Took an bath in Episom salt which has helped them scab over.  Occasionally itches.  Wants referral to derm  Saw allergist for her concern regarding food allergy.  Screening test negative.  Would like to see a nutritionist for further counseling about what foods she may or may not be able to eat.  Wants referral to endocrine for "fluctuating blood sugar levels."  She does not have glucometer but states that she use to check BS in past with previous PCP.  Reports feeling fogging brain and sweating when BS low.   She is also convinced that the changes in her vision is due to BS fluctuating.   I have screened her for DM and thyroid and both nl.  She has glaucoma and currently being treated by ophthalmologist.  Outpatient Encounter Medications as of 07/17/2019  Medication Sig Note  . albuterol (VENTOLIN HFA) 108 (90 Base) MCG/ACT inhaler Inhale 2 puffs into the lungs every 4 (four) hours as needed.   . Azelastine-Fluticasone 137-50 MCG/ACT SUSP Place 1 spray into the nose 2 (two) times daily.    . Cholecalciferol (VITAMIN D3) 5000 units TABS Take 5,000 Units by mouth daily.   Marland Kitchen Co-Enzyme Q-10 100 MG CAPS Take 100 mg by mouth daily.   Marland Kitchen docusate sodium (COLACE) 100 MG capsule Take 100 mg by mouth 2 (two) times daily.   . fluticasone (FLONASE) 50 MCG/ACT nasal spray Place 2 sprays into both nostrils daily.   Marland Kitchen latanoprost (XALATAN) 0.005 % ophthalmic solution Place 1 drop into the right eye at bedtime.  12/31/2013: .  . loratadine (CLARITIN) 10 MG tablet Take 10 mg by mouth daily.   . Magnesium Gluconate 500 (27 Mg) MG TABS Take 500 mg by mouth daily.   . montelukast (SINGULAIR) 10 MG tablet Take 1 tablet (10 mg total) by mouth at bedtime.   . Multiple Vitamins-Minerals (MULTIVITAMIN WOMEN PO) Take 1 tablet by mouth daily.   . naproxen sodium (ALEVE) 220 MG tablet Take 220 mg by mouth daily as needed.   . Omega-3 Fatty Acids (FISH OIL) 1200 MG CAPS Take 1,200 mg by mouth daily.   Vladimir Faster Glycol-Propyl Glycol 0.4-0.3 % SOLN Place 1 drop into both eyes daily.   . Thiamine HCl (VITAMIN B-1) 250 MG tablet Take 250 mg by mouth 2 (two) times daily.   . timolol (TIMOPTIC) 0.5 % ophthalmic solution Place 1 drop into the right eye 2 (two) times daily.    No facility-administered encounter medications on file as of 07/17/2019.      Observations/Objective: No direct observation done as this was a telephone  encounter.  Assessment and Plan: 1. Rash I have offered to have patient come for an in person visit with me for me to evaluate the rash.  However she requests referral to dermatology instead. - Ambulatory referral to Dermatology  2. Dietary counseling - Amb ref to Medical Nutrition Therapy-MNT  3. Overweight - Amb ref to Medical Nutrition Therapy-MNT  4. Low blood sugar reading Advised patient that I am not sure that endocrinologist would be able to offer much.  She is convinced that some of the problem she is having with her vision is due to blood sugar fluctuations.  However  diabetes screen has been negative. - Ambulatory referral to Endocrinology   Follow Up Instructions:    I discussed the assessment and treatment plan with the patient. The patient was provided an opportunity to ask questions and all were answered. The patient agreed with the plan and demonstrated an understanding of the instructions.   The patient was advised to call back or seek an in-person evaluation if the symptoms worsen or if the condition fails to improve as anticipated.  I provided 17 minutes of non-face-to-face time during this encounter.   Jonah Blue, MD

## 2019-07-20 ENCOUNTER — Encounter: Payer: Self-pay | Admitting: Internal Medicine

## 2019-07-20 DIAGNOSIS — J309 Allergic rhinitis, unspecified: Secondary | ICD-10-CM | POA: Insufficient documentation

## 2019-07-20 DIAGNOSIS — J45909 Unspecified asthma, uncomplicated: Secondary | ICD-10-CM | POA: Insufficient documentation

## 2019-07-21 DIAGNOSIS — Z9883 Filtering (vitreous) bleb after glaucoma surgery status: Secondary | ICD-10-CM | POA: Diagnosis not present

## 2019-07-21 DIAGNOSIS — H401113 Primary open-angle glaucoma, right eye, severe stage: Secondary | ICD-10-CM | POA: Diagnosis not present

## 2019-07-30 DIAGNOSIS — L668 Other cicatricial alopecia: Secondary | ICD-10-CM | POA: Diagnosis not present

## 2019-07-30 DIAGNOSIS — L309 Dermatitis, unspecified: Secondary | ICD-10-CM | POA: Diagnosis not present

## 2019-08-04 ENCOUNTER — Ambulatory Visit: Payer: Medicare Other | Admitting: Endocrinology

## 2019-08-05 ENCOUNTER — Encounter: Payer: Self-pay | Admitting: Internal Medicine

## 2019-08-05 NOTE — Progress Notes (Signed)
Note rec from [facility]  Scaly patches on the legs. ? Nummular dermatitis vs early guttate psoriasis.  Rec Q wk dilute bleach bath, apply 0.1% Triamcinolone oint1-2 x a day.  Reassured of benign nature.

## 2019-08-17 DIAGNOSIS — H401113 Primary open-angle glaucoma, right eye, severe stage: Secondary | ICD-10-CM | POA: Diagnosis not present

## 2019-08-17 DIAGNOSIS — H44512 Absolute glaucoma, left eye: Secondary | ICD-10-CM | POA: Diagnosis not present

## 2019-08-18 ENCOUNTER — Encounter: Payer: Self-pay | Admitting: Internal Medicine

## 2019-08-18 ENCOUNTER — Ambulatory Visit (INDEPENDENT_AMBULATORY_CARE_PROVIDER_SITE_OTHER): Payer: Medicare Other | Admitting: Internal Medicine

## 2019-08-18 ENCOUNTER — Other Ambulatory Visit: Payer: Self-pay

## 2019-08-18 VITALS — BP 108/68 | HR 61 | Temp 98.1°F | Ht 63.0 in | Wt 147.0 lb

## 2019-08-18 DIAGNOSIS — H538 Other visual disturbances: Secondary | ICD-10-CM | POA: Insufficient documentation

## 2019-08-18 LAB — GLUCOSE, POCT (MANUAL RESULT ENTRY): POC Glucose: 83 mg/dl (ref 70–99)

## 2019-08-18 NOTE — Progress Notes (Signed)
Name: Daisy Lites  MRN/ DOB: [id number], Jul 30, 1955    Age/ Sex: 64 y.o., female    PCP: Marcine Matar, MD   Reason for Endocrinology Evaluation: Low blood glucose      Date of Initial Endocrinology Evaluation: 08/18/2019     HPI: Ms. Jessica [name] is a 64 y.o. female with a past medical history of hyperlipidemia. The patient presented for initial endocrinology clinic visit on 08/18/2019 for consultative assistance with her low blood glucose.   Patient was seen by her PCP on 07/17/2019 and requested a referral to endocrinology for fluctuating blood sugars.  She did endorse fogginess of the brain and sweating when her BG's are low.  Patient also attributes visual changes to fluctuating BG's.  She has been screened for diabetes and thyroid disease through her PCP but results have been normal. Patient has glaucoma and been evaluated by ophthalmology.  A1c 5.1% - 11/2018 TSH 3.3 uIU/mL-  01/2019  Today she endorses vision changes when she eats starchy foods or fruits. She used to get episodes of feeling sweaty and clammy but this has gotten better over the years.    She had an intestinal surgery , she is unclear if this was large or small intestine.        HISTORY:  Past Medical History:  Past Medical History:  Diagnosis Date  . Glaucoma   . Hypoglycemia   . Optic neuropathy    Past Surgical History:  Past Surgical History:  Procedure Laterality Date  . ABDOMINAL HYSTERECTOMY    . CESAREAN SECTION    . EYE SURGERY    . SMALL INTESTINE SURGERY  2002  . TONSILLECTOMY    . TUBAL LIGATION        Social History:  reports that she quit smoking about 36 years ago. She has never used smokeless tobacco. She reports that she does not drink alcohol or use drugs.  Family History: family history includes Diabetes in her sister; Heart disease in her father; Hypertension in her sister; Ovarian cancer in her mother.   HOME MEDICATIONS: Allergies as of 08/18/2019      Reactions   Motrin [ibuprofen] Swelling   Tongue swells   Sulfa Antibiotics Swelling   Tongue swells   Beta Adrenergic Blockers Other (See Comments)   Eye infection (only with eye drops), shortness of breath   Contrast Media [iodinated Diagnostic Agents] Other (See Comments)   Only use non-ionic solutions per md   Gluten Meal Other (See Comments)   Rectal itching, intestinal inflammation   Macrobid [nitrofurantoin Monohyd Macro] Itching   Oxycodone-acetaminophen Other (See Comments)   hallucinations   Oxycodone-aspirin Other (See Comments)   hallucinations   Sudafed [pseudoephedrine Hcl]    ?swollen tongue   Ultram [tramadol] Other (See Comments)   hallucinations   Wheat Bran Other (See Comments)   Rectal itching and burning inside of body (particularly on right side)   Sugar-protein-starch Rash, Other (See Comments)   Foggy thoughts, blood sugar fluctuates excessively   Yeast-related Products Rash      Medication List       Accurate as of August 18, 2019 12:31 PM. If you have any questions, ask your nurse or doctor.        albuterol 108 (90 Base) MCG/ACT inhaler Commonly known as: VENTOLIN HFA Inhale 2 puffs into the lungs every 4 (four) hours as needed.   Azelastine-Fluticasone 137-50 MCG/ACT Susp Place 1 spray into the nose 2 (two) times daily.   Co-Enzyme  Q-10 100 MG Caps Take 100 mg by mouth daily.   docusate sodium 100 MG capsule Commonly known as: COLACE Take 100 mg by mouth 2 (two) times daily.   Fish Oil 1200 MG Caps Take 1,200 mg by mouth daily.   fluticasone 50 MCG/ACT nasal spray Commonly known as: FLONASE Place 2 sprays into both nostrils daily.   latanoprost 0.005 % ophthalmic solution Commonly known as: XALATAN Place 1 drop into the right eye at bedtime.   loratadine 10 MG tablet Commonly known as: CLARITIN Take 10 mg by mouth daily.   Magnesium Gluconate 500 (27 Mg) MG Tabs Take 500 mg by mouth daily.   montelukast 10 MG tablet Commonly known as:  SINGULAIR Take 1 tablet (10 mg total) by mouth at bedtime.   MULTIVITAMIN WOMEN PO Take 1 tablet by mouth daily.   naproxen sodium 220 MG tablet Commonly known as: ALEVE Take 220 mg by mouth daily as needed.   Polyethyl Glycol-Propyl Glycol 0.4-0.3 % Soln Place 1 drop into both eyes daily.   timolol 0.5 % ophthalmic solution Commonly known as: TIMOPTIC Place 1 drop into the right eye 2 (two) times daily.   vitamin B-1 250 MG tablet Take 250 mg by mouth 2 (two) times daily.   Vitamin D3 125 MCG (5000 UT) Tabs Take 5,000 Units by mouth daily.          OBJECTIVE:  VS: BP 108/68 (BP Location: Left Arm, Patient Position: Sitting, Cuff Size: Normal)   Pulse 61   Temp 98.1 F (36.7 C)   Ht 5\' 3"  (1.6 m)   Wt 147 lb (66.7 kg)   LMP  (Exact Date)   SpO2 99%   BMI 26.04 kg/m    Wt Readings from Last 3 Encounters:  08/18/19 147 lb (66.7 kg)  06/12/19 158 lb 3.2 oz (71.8 kg)  02/06/19 178 lb (80.7 kg)     EXAM: General: Pt appears well and is in NAD  Neck: General: Supple without adenopathy. Thyroid: Thyroid size normal.  No goiter or nodules appreciated. No thyroid bruit.  Lungs: Clear with good BS bilat with no rales, rhonchi, or wheezes  Heart: Auscultation: RRR.  Abdomen: Normoactive bowel sounds, soft, nontender, without masses or organomegaly palpable  Extremities:  BL LE: No pretibial edema.  Skin: Hair: Texture and amount normal with gender appropriate distribution Skin Inspection: No rashes  Neuro: Cranial nerves: II - XII grossly intact  Motor: Normal strength throughout  Mental Status: Judgment, insight: Intact Orientation: Oriented to time, place, and person Mood and affect: No depression, anxiety, or agitation     DATA REVIEWED:    Results for WILLA, BROCKS (MRN [id number]) as of 08/18/2019 12:31  Ref. Range 11/13/2018 16:24 02/06/2019 12:28  Hemoglobin A1C Latest Ref Range: 4.8 - 5.6 % 5.1   Est. average glucose Bld gHb Est-mCnc Latest Units: mg/dL  100   TSH Latest Ref Range: 0.450 - 4.500 uIU/mL  3.300  Results for MICHAELENE, DUTAN (MRN [id number]) as of 08/18/2019 12:31  Ref. Range 11/13/2018 16:24  Sodium Latest Ref Range: 134 - 144 mmol/L 144  Potassium Latest Ref Range: 3.5 - 5.2 mmol/L 5.1  Chloride Latest Ref Range: 96 - 106 mmol/L 103  CO2 Latest Ref Range: 20 - 29 mmol/L 29  Glucose Latest Ref Range: 65 - 99 mg/dL 79  BUN Latest Ref Range: 8 - 27 mg/dL 21  Creatinine Latest Ref Range: 0.57 - 1.00 mg/dL 0.84  Calcium Latest Ref Range: 8.7 - 10.3 mg/dL  9.3  BUN/Creatinine Ratio Latest Ref Range: 12 - 28  25   ASSESSMENT/PLAN/RECOMMENDATIONS:   1.  Blurry Vision :   -Patient endorses visual changes when eating certain foods, this is mainly associated with carbohydrate rich foods.  Of note the patient has glaucoma and is actively being treated for this.  I did explain to the patient that the main reason for visual changes is the glaucoma, we do not have any glucose data from the patient, this is purely subjective.  -Patient has history of intestinal resection, she is unclear if this was a small intestine versus the large intestine.  I did explain to the patient that intestinal resection especially when it involves the small intestine could cause absorption issues.  -Also, upon detailed questioning of the patient it seems like she would eat meals that consist of only carbohydrates such as sweet potato last night.  I did advise the patient of the importance of incorporating a well-balanced meals to include proteins and healthy fats if possible. -Patient is vegan and has a lot of dietary restrictions to include lactose intolerance and intolerance to nuts.  I have encouraged her to find vegan options for proteins such as tofu  Etc  -In the meantime she was encouraged to have a glucose meter at home if she is concerned about her glucose readings, I did assure her that she has no diabetes and no evidence of prediabetes. -Patient will get the  ReliOn meter from [facility] and was advised to check glucose fasting and when symptomatic. -Patient to contact us if her BG's fall below 60 mg/DL or above 161 mg/DL    Follow-up as needed  Signed electronically by: Lyndle Herrlich, MD  [facility]  [facility] [address] [place] [place], [place] [address] Phone: [phone] FAX: [phone]   CC: Marcine Matar, MD [address] [place] [address] Phone: [phone] Fax: [phone]   Return to Endocrinology clinic as below: Future Appointments  Date Time Provider Department Center  09/15/2019  1:30 PM Marcine Matar, MD [id number] None

## 2019-08-18 NOTE — Patient Instructions (Addendum)
-   Please Obtain the ReliOn Glucose meter from [facility] with strips  - Please check sugar fasting ( when you first wake up in the morning ) and when you feel bad - Please write your readings down if you sugars are below 60 mg/dL or over 150 mg/dL , also write down what is the last thing you ate  and drank if you get such readings.    - Please make sure you always incorporate proteins in your diet. Eating plain starchy food items, is going to make your sugar rise very quickly and drop very quickly. Also, the fact that you had a portion of your intestine removed in the past, may play a role in the absorptions of certain foods

## 2019-08-19 ENCOUNTER — Telehealth: Payer: Self-pay

## 2019-08-19 NOTE — Telephone Encounter (Signed)
Pt stated that she was requesting labs be done to test her hormones and not just her TSH, please advise.

## 2019-08-20 NOTE — Telephone Encounter (Signed)
Lft vm informing pt to contact PCP for further labs

## 2019-09-08 DIAGNOSIS — L300 Nummular dermatitis: Secondary | ICD-10-CM | POA: Diagnosis not present

## 2019-09-10 DIAGNOSIS — Z961 Presence of intraocular lens: Secondary | ICD-10-CM | POA: Diagnosis not present

## 2019-09-10 DIAGNOSIS — H401113 Primary open-angle glaucoma, right eye, severe stage: Secondary | ICD-10-CM | POA: Diagnosis not present

## 2019-09-10 DIAGNOSIS — H40831 Aqueous misdirection, right eye: Secondary | ICD-10-CM | POA: Diagnosis not present

## 2019-09-14 DIAGNOSIS — H401113 Primary open-angle glaucoma, right eye, severe stage: Secondary | ICD-10-CM | POA: Diagnosis not present

## 2019-09-14 DIAGNOSIS — H40831 Aqueous misdirection, right eye: Secondary | ICD-10-CM | POA: Diagnosis not present

## 2019-09-15 ENCOUNTER — Ambulatory Visit: Payer: Medicare Other | Attending: Internal Medicine | Admitting: Internal Medicine

## 2019-09-15 ENCOUNTER — Encounter: Payer: Self-pay | Admitting: Internal Medicine

## 2019-09-15 ENCOUNTER — Other Ambulatory Visit: Payer: Self-pay

## 2019-09-15 ENCOUNTER — Ambulatory Visit: Payer: Medicare Other | Admitting: Internal Medicine

## 2019-09-15 VITALS — BP 129/84 | HR 70 | Temp 98.1°F | Resp 16 | Wt 147.0 lb

## 2019-09-15 DIAGNOSIS — H409 Unspecified glaucoma: Secondary | ICD-10-CM

## 2019-09-15 DIAGNOSIS — R21 Rash and other nonspecific skin eruption: Secondary | ICD-10-CM

## 2019-09-15 NOTE — Progress Notes (Signed)
Patient ID: Jessica [name], female    DOB: 12-Jul-1955  MRN: [id number]  CC: No chief complaint on file.   Subjective: Jessica [name] is a 64 y.o. female who presents for chronic ds management.  Her spouse is with her.  Her concerns today include:  Pt with hx of glaucoma, HL  On last visit, patient complained of fluctuating blood sugar levels that she felt were affecting her eyes.  She requested referral to endocrinology.  She has seen the endocrinologist Dr. Lonzo Cloud.  Advised the patient that her issue most likely related to her glaucoma as previous screen for diabetes was negative.  She was advised to purchase an over-the-counter glucometer and check her blood sugars and report back if blood sugars are above or below a certain level.  She has not purchased a glucometer as yet.  Did not get to see nutritionist because her insurance will not cover given that she does not have hx of DM or obesity. Wants to know if I can refer her to different nutritionist/dietitian She also saw [facility] dermatology for rash on legs and buttock.  Scaly patches on the legs. ? Nummular dermatitis vs early guttate psoriasis.  Rec Q wk dilute bleach bath, apply 0.1% Triamcinolone oint1-2 x a day.  Reassured of benign nature.   She feels the rash related to food because sometimes she feels the  rash "gets activated" based on what she eats. Feels there is chemical imbalance and wonders if she needs to have full panel of hormones check like estrogen level.    Pressure in eye increased the last time she saw the ophthalmologist. Seen again yesterday and pressure had dec from 28 to 17 "but I did not eat."  She associates the fluctuating pressure in her eyes with eating.  She feels certain foods may be altering the pressure, causing the rash and causing fluctuation in her blood sugars.  HM:  Had partial hysterectomy for fibroids.  Part of cervix still present and reports being told that she would need to continue having Pap  smears because of it.. Had FIT test done through her insurance and await the results.  She will let me know once she receives the results. Patient Active Problem List   Diagnosis Date Noted  . Blurry vision 08/18/2019  . RAD (reactive airway disease) 07/20/2019  . Allergic rhinitis 07/20/2019  . Food allergy 05/18/2019  . Dysfunction of both eustachian tubes 07/08/2018  . Ventral hernia without obstruction or gangrene 07/08/2018  . Glaucoma of both eyes 07/08/2018  . Environmental allergies 07/08/2018  . Hyperlipidemia 07/08/2018     Current Outpatient Medications on File Prior to Visit  Medication Sig Dispense Refill  . albuterol (VENTOLIN HFA) 108 (90 Base) MCG/ACT inhaler Inhale 2 puffs into the lungs every 4 (four) hours as needed. 18 g 1  . Azelastine-Fluticasone 137-50 MCG/ACT SUSP Place 1 spray into the nose 2 (two) times daily. 23 g 5  . Cholecalciferol (VITAMIN D3) 5000 units TABS Take 5,000 Units by mouth daily.    Marland Kitchen Co-Enzyme Q-10 100 MG CAPS Take 100 mg by mouth daily.    Marland Kitchen docusate sodium (COLACE) 100 MG capsule Take 100 mg by mouth 2 (two) times daily.    . fluticasone (FLONASE) 50 MCG/ACT nasal spray Place 2 sprays into both nostrils daily.    Marland Kitchen latanoprost (XALATAN) 0.005 % ophthalmic solution Place 1 drop into the right eye at bedtime.     Marland Kitchen loratadine (CLARITIN) 10 MG tablet Take 10  mg by mouth daily.    . Magnesium Gluconate 500 (27 Mg) MG TABS Take 500 mg by mouth daily.    . montelukast (SINGULAIR) 10 MG tablet Take 1 tablet (10 mg total) by mouth at bedtime. (Patient not taking: Reported on 08/18/2019) 90 tablet 0  . Multiple Vitamins-Minerals (MULTIVITAMIN WOMEN PO) Take 1 tablet by mouth daily.    . naproxen sodium (ALEVE) 220 MG tablet Take 220 mg by mouth daily as needed.    . Omega-3 Fatty Acids (FISH OIL) 1200 MG CAPS Take 1,200 mg by mouth daily.    Bertram Gala Glycol-Propyl Glycol 0.4-0.3 % SOLN Place 1 drop into both eyes daily.    . Thiamine HCl (VITAMIN  B-1) 250 MG tablet Take 250 mg by mouth 2 (two) times daily.    . timolol (TIMOPTIC) 0.5 % ophthalmic solution Place 1 drop into the right eye 2 (two) times daily.     No current facility-administered medications on file prior to visit.    Allergies  Allergen Reactions  . Motrin [Ibuprofen] Swelling    Tongue swells  . Sulfa Antibiotics Swelling    Tongue swells  . Beta Adrenergic Blockers Other (See Comments)    Eye infection (only with eye drops), shortness of breath  . Contrast Media [Iodinated Diagnostic Agents] Other (See Comments)    Only use non-ionic solutions per md  . Gluten Meal Other (See Comments)    Rectal itching, intestinal inflammation  . Macrobid [Nitrofurantoin Monohyd Macro] Itching  . Oxycodone-Acetaminophen Other (See Comments)    hallucinations  . Oxycodone-Aspirin Other (See Comments)    hallucinations  . Sudafed [Pseudoephedrine Hcl]     ?swollen tongue  . Ultram [Tramadol] Other (See Comments)    hallucinations  . Wheat Bran Other (See Comments)    Rectal itching and burning inside of body (particularly on right side)  . Sugar-Protein-Starch Rash and Other (See Comments)    Foggy thoughts, blood sugar fluctuates excessively  . Yeast-Related Products Rash    Social History   Socioeconomic History  . Marital status: Married    Spouse name: Marilu Favre  . Number of children: 2  . Years of education: Not on file  . Highest education level: Bachelor's degree (e.g., BA, AB, BS)  Occupational History  . Occupation: disabled  Tobacco Use  . Smoking status: Former Smoker    Quit date: 1985    Years since quitting: 36.3  . Smokeless tobacco: Never Used  Substance and Sexual Activity  . Alcohol use: No  . Drug use: No  . Sexual activity: Not Currently  Other Topics Concern  . Not on file  Social History Narrative   Patient is right-handed. She lives with her husband in a 1 story house. She does not exercise and only occasionally drinks caffeine.    Social Determinants of Health   Financial Resource Strain:   . Difficulty of Paying Living Expenses:   Food Insecurity:   . Worried About Programme researcher, broadcasting/film/video in the Last Year:   . Barista in the Last Year:   Transportation Needs:   . Freight forwarder (Medical):   Marland Kitchen Lack of Transportation (Non-Medical):   Physical Activity:   . Days of Exercise per Week:   . Minutes of Exercise per Session:   Stress:   . Feeling of Stress :   Social Connections:   . Frequency of Communication with Friends and Family:   . Frequency of Social Gatherings with Friends and  Family:   . Attends Religious Services:   . Active Member of Clubs or Organizations:   . Attends Archivist Meetings:   Marland Kitchen Marital Status:   Intimate Partner Violence:   . Fear of Current or Ex-Partner:   . Emotionally Abused:   Marland Kitchen Physically Abused:   . Sexually Abused:     Family History  Problem Relation Age of Onset  . Ovarian cancer Mother   . Heart disease Father   . Diabetes Sister   . Hypertension Sister     Past Surgical History:  Procedure Laterality Date  . ABDOMINAL HYSTERECTOMY    . CESAREAN SECTION    . EYE SURGERY    . SMALL INTESTINE SURGERY  2002  . TONSILLECTOMY    . TUBAL LIGATION      ROS: Review of Systems Negative except as stated above  PHYSICAL EXAM: BP 129/84   Pulse 70   Temp 98.1 F (36.7 C)   Resp 16   Wt 147 lb (66.7 kg)   SpO2 98%   BMI 26.04 kg/m   Physical Exam  General appearance - alert, well appearing, and in no distress Mental status - normal mood, behavior, speech, dress, motor activity, and thought processes Chest - clear to auscultation, no wheezes, rales or rhonchi, symmetric air entry Heart - normal rate, regular rhythm, normal S1, S2, no murmurs, rubs, clicks or gallops Extremities -no lower extremity edema.  She has some varicose veins in the legs. Skin -blanching 2 to 3 cm spot on the medial aspect of the left calf.  It is not raised  and not tender to touch.  She has a small 1 noted close to the medial aspect of the popliteal area  varicose veins  CMP Latest Ref Rng & Units 11/13/2018 07/08/2018 05/24/2017  Glucose 65 - 99 mg/dL 79 - 80  BUN 8 - 27 mg/dL 21 - 14  Creatinine 0.57 - 1.00 mg/dL 0.84 - 0.70  Sodium 134 - 144 mmol/L 144 - 144  Potassium 3.5 - 5.2 mmol/L 5.1 - 4.2  Chloride 96 - 106 mmol/L 103 - 107  CO2 20 - 29 mmol/L 29 - -  Calcium 8.7 - 10.3 mg/dL 9.3 - -  Total Protein 6.0 - 8.5 g/dL - 7.5 -  Total Bilirubin 0.0 - 1.2 mg/dL - 0.4 -  Alkaline Phos 39 - 117 IU/L - 92 -  AST 0 - 40 IU/L - 39 -  ALT 0 - 32 IU/L - 27 -   Lipid Panel     Component Value Date/Time   CHOL 223 (H) 11/17/2018 1137   TRIG 35 11/17/2018 1137   HDL 84 11/17/2018 1137   CHOLHDL 2.7 11/17/2018 1137   LDLCALC 132 (H) 11/17/2018 1137    CBC    Component Value Date/Time   WBC 5.6 05/24/2017 1613   RBC 4.30 05/24/2017 1613   HGB 12.6 05/24/2017 1713   HCT 37.0 05/24/2017 1713   PLT 226 05/24/2017 1613   MCV 87.7 05/24/2017 1613   MCH 29.3 05/24/2017 1613   MCHC 33.4 05/24/2017 1613   RDW 14.0 05/24/2017 1613   LYMPHSABS 2.2 05/24/2017 1613   MONOABS 0.4 05/24/2017 1613   EOSABS 0.0 05/24/2017 1613   BASOSABS 0.0 05/24/2017 1613    ASSESSMENT AND PLAN: 1. Glaucoma of both eyes, unspecified glaucoma type Followed by ophthalmology.  2. Rash Seen by dermatology. Advised patient that I doubt checking her hormone levels will add anything significant to explain her  symptoms.  I would expect her female hormone levels to be in the menopausal range since she is postmenopausal.  Thyroid level was checked September of last year and it was normal.     Patient was given the opportunity to ask questions.  Patient verbalized understanding of the plan and was able to repeat key elements of the plan.   No orders of the defined types were placed in this encounter.    Requested Prescriptions    No prescriptions requested or  ordered in this encounter    Return in about 6 weeks (around 10/27/2019) for PAP.  Jonah Blue, MD, FACP

## 2019-09-29 DIAGNOSIS — Z961 Presence of intraocular lens: Secondary | ICD-10-CM | POA: Diagnosis not present

## 2019-09-29 DIAGNOSIS — H1820 Unspecified corneal edema: Secondary | ICD-10-CM | POA: Diagnosis not present

## 2019-09-29 DIAGNOSIS — H469 Unspecified optic neuritis: Secondary | ICD-10-CM | POA: Diagnosis not present

## 2019-09-29 DIAGNOSIS — H44512 Absolute glaucoma, left eye: Secondary | ICD-10-CM | POA: Diagnosis not present

## 2019-09-29 DIAGNOSIS — H401113 Primary open-angle glaucoma, right eye, severe stage: Secondary | ICD-10-CM | POA: Diagnosis not present

## 2019-10-01 DIAGNOSIS — H401113 Primary open-angle glaucoma, right eye, severe stage: Secondary | ICD-10-CM | POA: Diagnosis not present

## 2019-10-01 DIAGNOSIS — Z79899 Other long term (current) drug therapy: Secondary | ICD-10-CM | POA: Diagnosis not present

## 2019-10-01 DIAGNOSIS — H40831 Aqueous misdirection, right eye: Secondary | ICD-10-CM | POA: Diagnosis not present

## 2019-10-09 DIAGNOSIS — H52201 Unspecified astigmatism, right eye: Secondary | ICD-10-CM | POA: Diagnosis not present

## 2019-10-09 DIAGNOSIS — H5211 Myopia, right eye: Secondary | ICD-10-CM | POA: Diagnosis not present

## 2019-10-09 DIAGNOSIS — H26493 Other secondary cataract, bilateral: Secondary | ICD-10-CM | POA: Diagnosis not present

## 2019-10-09 DIAGNOSIS — H40831 Aqueous misdirection, right eye: Secondary | ICD-10-CM | POA: Diagnosis not present

## 2019-10-09 DIAGNOSIS — H401113 Primary open-angle glaucoma, right eye, severe stage: Secondary | ICD-10-CM | POA: Diagnosis not present

## 2019-10-11 HISTORY — PX: EYE SURGERY: SHX253

## 2019-10-19 DIAGNOSIS — H40831 Aqueous misdirection, right eye: Secondary | ICD-10-CM | POA: Diagnosis not present

## 2019-10-19 DIAGNOSIS — H401113 Primary open-angle glaucoma, right eye, severe stage: Secondary | ICD-10-CM | POA: Diagnosis not present

## 2019-10-27 ENCOUNTER — Other Ambulatory Visit: Payer: Self-pay

## 2019-10-27 ENCOUNTER — Ambulatory Visit (HOSPITAL_BASED_OUTPATIENT_CLINIC_OR_DEPARTMENT_OTHER): Payer: Medicare Other | Admitting: Internal Medicine

## 2019-10-27 ENCOUNTER — Other Ambulatory Visit (HOSPITAL_COMMUNITY)
Admission: RE | Admit: 2019-10-27 | Discharge: 2019-10-27 | Disposition: A | Discharge status: Home / Self Care | Payer: Medicare Other | Source: Ambulatory Visit | Attending: Internal Medicine | Admitting: Internal Medicine

## 2019-10-27 ENCOUNTER — Encounter: Payer: Self-pay | Admitting: Internal Medicine

## 2019-10-27 VITALS — BP 102/71 | HR 71 | Temp 98.1°F | Resp 16 | Ht 63.0 in | Wt 140.0 lb

## 2019-10-27 DIAGNOSIS — Z1151 Encounter for screening for human papillomavirus (HPV): Secondary | ICD-10-CM | POA: Diagnosis not present

## 2019-10-27 DIAGNOSIS — R300 Dysuria: Secondary | ICD-10-CM | POA: Diagnosis not present

## 2019-10-27 DIAGNOSIS — Z1211 Encounter for screening for malignant neoplasm of colon: Secondary | ICD-10-CM

## 2019-10-27 DIAGNOSIS — Z90711 Acquired absence of uterus with remaining cervical stump: Secondary | ICD-10-CM | POA: Diagnosis not present

## 2019-10-27 DIAGNOSIS — Z124 Encounter for screening for malignant neoplasm of cervix: Secondary | ICD-10-CM | POA: Insufficient documentation

## 2019-10-27 NOTE — Progress Notes (Signed)
Patient ID: Jessica [name], female    DOB: 08/31/1955  MRN: [id number]  CC: Gynecologic Exam   Subjective: Jessica [name] is a 65 y.o. female who presents for pap Her concerns today include:  Pt with hx of glaucoma BL with optic neuropathy, HL  Pt excited today.  Had laser surgery on RT eye for cataract last wk with good results. Plans to have corneal transplant to LT eye.   HM: did not get results of FIT test. Did not get the COVID vaccine.  She is decided to hold off on getting it for now.  Pt is G5P2 (set of twins living. 1 ectopic and 3 abortions) No abn pap in past No vaginal discgh.  Little dysuria this a.m Use to get pain in RT lower abdomen with intercourse but not any more.  Sexually active with husband.  Patient had partial hysterectomy in the past for fibroids.  She tells me that her cervix was left in place. Mother with cervical or ovarian cancer.   Patient Active Problem List   Diagnosis Date Noted  . Blurry vision 08/18/2019  . RAD (reactive airway disease) 07/20/2019  . Allergic rhinitis 07/20/2019  . Food allergy 05/18/2019  . Dysfunction of both eustachian tubes 07/08/2018  . Ventral hernia without obstruction or gangrene 07/08/2018  . Glaucoma of both eyes 07/08/2018  . Environmental allergies 07/08/2018  . Hyperlipidemia 07/08/2018     Current Outpatient Medications on File Prior to Visit  Medication Sig Dispense Refill  . albuterol (VENTOLIN HFA) 108 (90 Base) MCG/ACT inhaler Inhale 2 puffs into the lungs every 4 (four) hours as needed. 18 g 1  . Azelastine-Fluticasone 137-50 MCG/ACT SUSP Place 1 spray into the nose 2 (two) times daily. 23 g 5  . Cholecalciferol (VITAMIN D3) 5000 units TABS Take 5,000 Units by mouth daily.    Marland Kitchen Co-Enzyme Q-10 100 MG CAPS Take 100 mg by mouth daily.    Marland Kitchen docusate sodium (COLACE) 100 MG capsule Take 100 mg by mouth 2 (two) times daily.    . fluticasone (FLONASE) 50 MCG/ACT nasal spray Place 2 sprays into both nostrils  daily.    Marland Kitchen latanoprost (XALATAN) 0.005 % ophthalmic solution Place 1 drop into the right eye at bedtime.     Marland Kitchen loratadine (CLARITIN) 10 MG tablet Take 10 mg by mouth daily.    . Magnesium Gluconate 500 (27 Mg) MG TABS Take 500 mg by mouth daily.    . montelukast (SINGULAIR) 10 MG tablet Take 1 tablet (10 mg total) by mouth at bedtime. (Patient not taking: Reported on 08/18/2019) 90 tablet 0  . Multiple Vitamins-Minerals (MULTIVITAMIN WOMEN PO) Take 1 tablet by mouth daily.    . naproxen sodium (ALEVE) 220 MG tablet Take 220 mg by mouth daily as needed.    . Omega-3 Fatty Acids (FISH OIL) 1200 MG CAPS Take 1,200 mg by mouth daily.    Bertram Gala Glycol-Propyl Glycol 0.4-0.3 % SOLN Place 1 drop into both eyes daily.    . Thiamine HCl (VITAMIN B-1) 250 MG tablet Take 250 mg by mouth 2 (two) times daily.    . timolol (TIMOPTIC) 0.5 % ophthalmic solution Place 1 drop into the right eye 2 (two) times daily.     No current facility-administered medications on file prior to visit.    Allergies  Allergen Reactions  . Motrin [Ibuprofen] Swelling    Tongue swells  . Sulfa Antibiotics Swelling    Tongue swells  . Beta Adrenergic Blockers Other (  See Comments)    Eye infection (only with eye drops), shortness of breath  . Contrast Media [Iodinated Diagnostic Agents] Other (See Comments)    Only use non-ionic solutions per md  . Gluten Meal Other (See Comments)    Rectal itching, intestinal inflammation  . Macrobid [Nitrofurantoin Monohyd Macro] Itching  . Oxycodone-Acetaminophen Other (See Comments)    hallucinations  . Oxycodone-Aspirin Other (See Comments)    hallucinations  . Sudafed [Pseudoephedrine Hcl]     ?swollen tongue  . Ultram [Tramadol] Other (See Comments)    hallucinations  . Wheat Bran Other (See Comments)    Rectal itching and burning inside of body (particularly on right side)  . Sugar-Protein-Starch Rash and Other (See Comments)    Foggy thoughts, blood sugar fluctuates  excessively  . Yeast-Related Products Rash    Social History   Socioeconomic History  . Marital status: Married    Spouse name: Marilu Favre  . Number of children: 2  . Years of education: Not on file  . Highest education level: Bachelor's degree (e.g., BA, AB, BS)  Occupational History  . Occupation: disabled  Tobacco Use  . Smoking status: Former Smoker    Quit date: 1985    Years since quitting: 36.4  . Smokeless tobacco: Never Used  Vaping Use  . Vaping Use: Never used  Substance and Sexual Activity  . Alcohol use: No  . Drug use: No  . Sexual activity: Not Currently  Other Topics Concern  . Not on file  Social History Narrative   Patient is right-handed. She lives with her husband in a 1 story house. She does not exercise and only occasionally drinks caffeine.   Social Determinants of Health   Financial Resource Strain:   . Difficulty of Paying Living Expenses:   Food Insecurity:   . Worried About Programme researcher, broadcasting/film/video in the Last Year:   . Barista in the Last Year:   Transportation Needs:   . Freight forwarder (Medical):   Marland Kitchen Lack of Transportation (Non-Medical):   Physical Activity:   . Days of Exercise per Week:   . Minutes of Exercise per Session:   Stress:   . Feeling of Stress :   Social Connections:   . Frequency of Communication with Friends and Family:   . Frequency of Social Gatherings with Friends and Family:   . Attends Religious Services:   . Active Member of Clubs or Organizations:   . Attends Banker Meetings:   Marland Kitchen Marital Status:   Intimate Partner Violence:   . Fear of Current or Ex-Partner:   . Emotionally Abused:   Marland Kitchen Physically Abused:   . Sexually Abused:     Family History  Problem Relation Age of Onset  . Ovarian cancer Mother   . Heart disease Father   . Diabetes Sister   . Hypertension Sister     Past Surgical History:  Procedure Laterality Date  . ABDOMINAL HYSTERECTOMY    . CESAREAN SECTION    .  EYE SURGERY    . SMALL INTESTINE SURGERY  2002  . TONSILLECTOMY    . TUBAL LIGATION      ROS: Review of Systems Negative except as stated above  PHYSICAL EXAM: BP 102/71   Pulse 71   Temp 98.1 F (36.7 C)   Resp 16   Ht 5\' 3"  (1.6 m)   Wt 140 lb (63.5 kg)   SpO2 95%   BMI 24.80 kg/m  Physical Exam  General appearance - alert, well appearing, and in no distress Eyes -cornea of the left eye is opaque Abdomen - soft, nontender, nondistended, no masses or organomegaly Breasts -CMA Pollock present for breast and pelvic exam: Breasts appear normal, no suspicious masses, no skin or nipple changes or axillary nodes Pelvic -no external vaginal lesions.  She has atrophy of vaginal opening and loss of normal rugae of vaginal wall.  No abnormal looking discharge noted in the vaginal vault.  She does appear to have a cervix with small cysts located between the 2 and 3 o'clock position.  No cervical motion tenderness.  No adnexal masses felt.   CMP Latest Ref Rng & Units 11/13/2018 07/08/2018 05/24/2017  Glucose 65 - 99 mg/dL 79 - 80  BUN 8 - 27 mg/dL 21 - 14  Creatinine 0.57 - 1.00 mg/dL 0.84 - 0.70  Sodium 134 - 144 mmol/L 144 - 144  Potassium 3.5 - 5.2 mmol/L 5.1 - 4.2  Chloride 96 - 106 mmol/L 103 - 107  CO2 20 - 29 mmol/L 29 - -  Calcium 8.7 - 10.3 mg/dL 9.3 - -  Total Protein 6.0 - 8.5 g/dL - 7.5 -  Total Bilirubin 0.0 - 1.2 mg/dL - 0.4 -  Alkaline Phos 39 - 117 IU/L - 92 -  AST 0 - 40 IU/L - 39 -  ALT 0 - 32 IU/L - 27 -   Lipid Panel     Component Value Date/Time   CHOL 223 (H) 11/17/2018 1137   TRIG 35 11/17/2018 1137   HDL 84 11/17/2018 1137   CHOLHDL 2.7 11/17/2018 1137   LDLCALC 132 (H) 11/17/2018 1137    CBC    Component Value Date/Time   WBC 5.6 05/24/2017 1613   RBC 4.30 05/24/2017 1613   HGB 12.6 05/24/2017 1713   HCT 37.0 05/24/2017 1713   PLT 226 05/24/2017 1613   MCV 87.7 05/24/2017 1613   MCH 29.3 05/24/2017 1613   MCHC 33.4 05/24/2017 1613   RDW  14.0 05/24/2017 1613   LYMPHSABS 2.2 05/24/2017 1613   MONOABS 0.4 05/24/2017 1613   EOSABS 0.0 05/24/2017 1613   BASOSABS 0.0 05/24/2017 1613    ASSESSMENT AND PLAN:  1. Pap smear for cervical cancer screening - Cytology - PAP  2. Screening for colon cancer She has not received the results of the fit test that was done by her insurance.  She states that she tried calling them but they told her that they cannot give her results over the phone.  She is agreeable to doing the Cologuard instead. - Cologuard  3. Dysuria - Urinalysis, Routine w reflex microscopic   Patient was given the opportunity to ask questions.  Patient verbalized understanding of the plan and was able to repeat key elements of the plan.   Orders Placed This Encounter  Procedures  . Cologuard  . Urinalysis, Routine w reflex microscopic     Requested Prescriptions    No prescriptions requested or ordered in this encounter    Return in about 5 months (around 03/28/2020).  Karle Plumber, MD, FACP

## 2019-10-28 LAB — URINALYSIS, ROUTINE W REFLEX MICROSCOPIC
Bilirubin, UA: NEGATIVE
Glucose, UA: NEGATIVE
Leukocytes,UA: NEGATIVE
Nitrite, UA: NEGATIVE
Protein,UA: NEGATIVE
RBC, UA: NEGATIVE
Specific Gravity, UA: 1.018 (ref 1.005–1.030)
Urobilinogen, Ur: 0.2 mg/dL (ref 0.2–1.0)
pH, UA: 7 (ref 5.0–7.5)

## 2019-10-28 LAB — CYTOLOGY - PAP
Comment: NEGATIVE
Diagnosis: NEGATIVE
High risk HPV: NEGATIVE

## 2019-10-29 ENCOUNTER — Telehealth: Payer: Self-pay

## 2019-10-29 NOTE — Telephone Encounter (Signed)
Contacted pt to go over pap/urine results pt didn't answer lvm asking pt to give a call back at her earliest convenience  If pt calls back please give results: Pap smear was normal meaning no cancer cells seen. Urinalysis reveals no infection.

## 2019-11-12 DIAGNOSIS — Z79899 Other long term (current) drug therapy: Secondary | ICD-10-CM | POA: Diagnosis not present

## 2019-11-12 DIAGNOSIS — H401113 Primary open-angle glaucoma, right eye, severe stage: Secondary | ICD-10-CM | POA: Diagnosis not present

## 2019-11-12 DIAGNOSIS — H40831 Aqueous misdirection, right eye: Secondary | ICD-10-CM | POA: Diagnosis not present

## 2019-11-19 DIAGNOSIS — Z1211 Encounter for screening for malignant neoplasm of colon: Secondary | ICD-10-CM | POA: Diagnosis not present

## 2019-11-25 LAB — COLOGUARD
Cologuard: NEGATIVE
Cologuard: NEGATIVE

## 2019-12-01 ENCOUNTER — Telehealth: Payer: Self-pay | Admitting: Allergy

## 2019-12-01 ENCOUNTER — Telehealth: Payer: Self-pay | Admitting: Internal Medicine

## 2019-12-01 MED ORDER — FLUTICASONE PROPIONATE 50 MCG/ACT NA SUSP: 2.0000 | Freq: Every day | NASAL | 3 refills | Status: DC

## 2019-12-01 NOTE — Telephone Encounter (Signed)
Flonase sent to pharmacy and patient has been notified.

## 2019-12-01 NOTE — Telephone Encounter (Signed)
Patient called and said the nose spray was too strong and she would like for you to call in Flonase to [facility] on [address] [phone].

## 2019-12-01 NOTE — Telephone Encounter (Signed)
Yes that is fine to change back to Flonase 2 sprays daily for congestion.

## 2019-12-01 NOTE — Telephone Encounter (Signed)
Called to verify nose spray and patient stated the Dymista was too strong, made her face feel funny and is causing a burning sensation. Is it ok to change to Flonase per patients request? She states she used to take it 2 sprays daily.

## 2019-12-02 ENCOUNTER — Encounter: Payer: Self-pay | Admitting: Allergy

## 2019-12-02 ENCOUNTER — Ambulatory Visit (INDEPENDENT_AMBULATORY_CARE_PROVIDER_SITE_OTHER): Payer: Medicare Other | Admitting: Allergy

## 2019-12-02 ENCOUNTER — Other Ambulatory Visit: Payer: Self-pay

## 2019-12-02 VITALS — BP 104/60 | HR 93 | Temp 97.9°F | Ht 63.5 in | Wt 135.0 lb

## 2019-12-02 DIAGNOSIS — J018 Other acute sinusitis: Secondary | ICD-10-CM | POA: Diagnosis not present

## 2019-12-02 DIAGNOSIS — T781XXD Other adverse food reactions, not elsewhere classified, subsequent encounter: Secondary | ICD-10-CM

## 2019-12-02 DIAGNOSIS — L300 Nummular dermatitis: Secondary | ICD-10-CM | POA: Diagnosis not present

## 2019-12-02 NOTE — Patient Instructions (Addendum)
Sinusitis - inflammation of sinus tract  - environmental allergy panel was negative  - continue either Zyrtec 10mg , Allegra 180mg  or Xyzal 5mg  daily  - stop Dymista due to not able to tolerate  - recommend discussing Flonase use with your ophthalmologist.  is a nasal steroid spray that can have some impact on eye pressure.  If ok to use from your ophthalmologist can continue.  Let know if able to continue or not  - start nasal saline rinses.  This a nasal flush of your sinuses.  Use either boiled water brought down to room temperature or use distilled water.  Breathe thru your mother the entire process  - will resume use of Singulair 10mg  daily at bedtime  - have access to albuterol inhaler 2 puffs every 4-6 hours as needed for cough/wheeze/shortness of breath/chest tightness.  May use 15-20 minutes prior to activity.   Monitor frequency of use.     Adverse food reaction   - serum IgE to foods discussed from initial including nuts, dairy, tomato, lemon, yeast, gluten are negative.  You do not have IgE mediated food allergy to these foods.    - sugar, additive and preservatives are often intolerances.  We do not have any testing available at this time for additives or preservatives thus avoidance if possible is recommended  - recommend keeping a food journal to document foods and symptoms you develop with ingestion  - we have discussed the following in regards to foods:   Allergy: food allergy is when you have eaten a food, developed an allergic reaction after eating the food and have IgE to the food (positive food testing either by skin testing or blood testing).  Food allergy could lead to life threatening symptoms  Sensitivity: occurs when you have IgE to a food (positive food testing either by skin testing or blood testing) but is a food you eat without any issues.  This is not an allergy and we recommend keeping the food in the diet  Intolerance: this is when you have negative testing  by either skin testing or blood testing thus not allergic but the food causes symptoms (like belly pain, bloating, diarrhea etc) with ingestion.  These foods should be avoided to prevent symptoms.    Nummular eczema  - continue your topical steroid cream as directed by your dermatologist  - daily moisturization    Follow-up 4 months or sooner if needed

## 2019-12-02 NOTE — Progress Notes (Signed)
Follow-up Note  RE: Jessica [name] MRN: [id number] DOB: 20-May-1955 Date of Office Visit: 12/02/2019   History of present illness: Jessica [name] is a 64 y.o. female presenting today for sinus pain for the past week.  History and physical obtained by Dr. Quincy Simmonds, MD, [facility] medicine resident.   She describes the pain as burning of the right sinus with pain behind the right eye. She also has associated ear popping in both ears. Notes pain started when she used her dymista together with new eye drops for glaucoma. She was using dymista prior to this sinus pain and feels that the spray has always caused a little burning when she uses it without improvement of nasal congeston. She has since stopped both medications but continues to have sinus pain. She is using Allegra and Flonase for nasal congestion and rhinitis. Environmental allergy panel from last visit was unremarkable. She also notes chest tightness when using the glaucoma medication and has been using albuterol for the past week. States the chest tightness is much improved since using the albuterol, now using about 1 puff a day in the mornings.  She is awaiting a call back from her ophthalmologist in regards to stopping the glaucoma eye drop. Switched from atropine and timinolo eyedrops to tropicamide in July due to chest tightness.  She is seeing dermatologist for circular skin rashes. She states they are initially start as pruritic blisters that burst to form circular rashes with skin flaking. She is using triamcinolone cream from dermatologist with improvement.   Review of systems: Review of Systems  Constitutional: Negative.   HENT: Positive for sinus pain.        Ear popping  Eyes: Positive for pain. Negative for discharge and redness.  Respiratory: Positive for cough.   Cardiovascular: Negative.   Gastrointestinal: Negative.   Musculoskeletal: Negative.   Skin: Positive for rash.  Neurological: Negative.     All other systems  negative unless noted above in HPI  Past medical/social/surgical/family history have been reviewed and are unchanged unless specifically indicated below.  None.  Medication List: Current Outpatient Medications  Medication Sig Dispense Refill  . albuterol (VENTOLIN HFA) 108 (90 Base) MCG/ACT inhaler Inhale 2 puffs into the lungs every 4 (four) hours as needed. 18 g 1  . Cholecalciferol (VITAMIN D3) 5000 units TABS Take 5,000 Units by mouth daily.    Marland Kitchen Co-Enzyme Q-10 100 MG CAPS Take 100 mg by mouth daily.    Marland Kitchen docusate sodium (COLACE) 100 MG capsule Take 100 mg by mouth 2 (two) times daily.    . fluticasone (FLONASE) 50 MCG/ACT nasal spray Place 2 sprays into both nostrils daily.    . fluticasone (FLONASE) 50 MCG/ACT nasal spray Place 2 sprays into both nostrils daily. 16 g 3  . latanoprost (XALATAN) 0.005 % ophthalmic solution Place 1 drop into the right eye at bedtime.     . Magnesium Gluconate 500 (27 Mg) MG TABS Take 500 mg by mouth daily.    . Multiple Vitamins-Minerals (MULTIVITAMIN WOMEN PO) Take 1 tablet by mouth daily.    . naproxen sodium (ALEVE) 220 MG tablet Take 220 mg by mouth daily as needed.    . Omega-3 Fatty Acids (FISH OIL) 1200 MG CAPS Take 1,200 mg by mouth daily.    . Thiamine HCl (VITAMIN B-1) 250 MG tablet Take 250 mg by mouth 2 (two) times daily.    Marland Kitchen loratadine (CLARITIN) 10 MG tablet Take 10 mg by mouth daily. (Patient not taking:  Reported on 12/02/2019)    . montelukast (SINGULAIR) 10 MG tablet Take 1 tablet (10 mg total) by mouth at bedtime. (Patient not taking: Reported on 08/18/2019) 90 tablet 0  . Polyethyl Glycol-Propyl Glycol 0.4-0.3 % SOLN Place 1 drop into both eyes daily. (Patient not taking: Reported on 12/02/2019)    . timolol (TIMOPTIC) 0.5 % ophthalmic solution Place 1 drop into the right eye 2 (two) times daily. (Patient not taking: Reported on 12/02/2019)     No current facility-administered medications for this visit.     Known medication  allergies: Allergies  Allergen Reactions  . Motrin [Ibuprofen] Swelling    Tongue swells  . Sulfa Antibiotics Swelling    Tongue swells  . Beta Adrenergic Blockers Other (See Comments)    Eye infection (only with eye drops), shortness of breath  . Contrast Media [Iodinated Diagnostic Agents] Other (See Comments)    Only use non-ionic solutions per md  . Gluten Meal Other (See Comments)    Rectal itching, intestinal inflammation  . Macrobid [Nitrofurantoin Monohyd Macro] Itching  . Oxycodone-Acetaminophen Other (See Comments)    hallucinations  . Oxycodone-Aspirin Other (See Comments)    hallucinations  . Sudafed [Pseudoephedrine Hcl]     ?swollen tongue  . Ultram [Tramadol] Other (See Comments)    hallucinations  . Wheat Bran Other (See Comments)    Rectal itching and burning inside of body (particularly on right side)  . Sugar-Protein-Starch Rash and Other (See Comments)    Foggy thoughts, blood sugar fluctuates excessively  . Yeast-Related Products Rash     Physical examination: Blood pressure 104/60, pulse 93, temperature 97.9 F (36.6 C), height 5' 3.5" (1.613 m), weight 135 lb (61.2 kg), SpO2 98 %.  General: Alert, interactive, in no acute distress. HEENT: TMs pearly gray, turbinates non-edematous without discharge, post-pharynx non erythematous. Neck: Supple without lymphadenopathy. Lungs: Clear to auscultation without wheezing, rhonchi or rales. {no increased work of breathing. CV: Normal S1, S2 without murmurs. Abdomen: Nondistended, nontender. Skin: Scattered round erythematous patches with scaling on legs and feet. Extremities:  No clubbing, cyanosis or edema. Neuro:   Grossly intact.  Diagnositics/Labs: none  Assessment and plan:   Sinusitis - inflammation of sinus tract  - environmental allergy panel was negative  - continue either Zyrtec 10mg , Allegra 180mg  or Xyzal 5mg  daily  - stop Dymista due to not able to tolerate  - recommend discussing Flonase  use with your ophthalmologist.  is a nasal steroid spray that can have some impact on eye pressure.  If ok to use from your ophthalmologist can continue.  Let know if able to continue or not  - start nasal saline rinses.  This a nasal flush of your sinuses.  Use either boiled water brought down to room temperature or use distilled water.  Breathe thru your mother the entire process  - will resume use of Singulair 10mg  daily at bedtime  - have access to albuterol inhaler 2 puffs every 4-6 hours as needed for cough/wheeze/shortness of breath/chest tightness.  May use 15-20 minutes prior to activity.   Monitor frequency of use.    - due to history of glaucoma will refrain from systemic steroid use   Adverse food reaction   - serum IgE to foods discussed from initial including nuts, dairy, tomato, lemon, yeast, gluten are negative.  You do not have IgE mediated food allergy to these foods.    - sugar, additive and preservatives are often intolerances.  We do not  have any testing available at this time for additives or preservatives thus avoidance if possible is recommended  - recommend keeping a food journal to document foods and symptoms you develop with ingestion  - we have discussed the following in regards to foods:   Allergy: food allergy is when you have eaten a food, developed an allergic reaction after eating the food and have IgE to the food (positive food testing either by skin testing or blood testing).  Food allergy could lead to life threatening symptoms  Sensitivity: occurs when you have IgE to a food (positive food testing either by skin testing or blood testing) but is a food you eat without any issues.  This is not an allergy and we recommend keeping the food in the diet  Intolerance: this is when you have negative testing by either skin testing or blood testing thus not allergic but the food causes symptoms (like belly pain, bloating, diarrhea etc) with ingestion.  These foods  should be avoided to prevent symptoms.    Nummular eczema  - continue your topical steroid cream as directed by your dermatologist  - daily moisturization    Follow-up 4 months or sooner if needed  I appreciate the opportunity to take part in Issabela's care. Please do not hesitate to contact me with questions.  Sincerely,   Margo Aye, MD Allergy/Immunology [facility]

## 2019-12-03 ENCOUNTER — Telehealth: Payer: Self-pay

## 2019-12-03 ENCOUNTER — Other Ambulatory Visit: Payer: Self-pay

## 2019-12-03 DIAGNOSIS — Z9109 Other allergy status, other than to drugs and biological substances: Secondary | ICD-10-CM

## 2019-12-03 MED ORDER — ALBUTEROL SULFATE HFA 108 (90 BASE) MCG/ACT IN AERS: 2.0000 | INHALATION_SPRAY | RESPIRATORY_TRACT | 2 refills | Status: DC | PRN

## 2019-12-03 MED ORDER — MONTELUKAST SODIUM 10 MG PO TABS: 10.0000 mg | ORAL_TABLET | Freq: Every day | ORAL | 1 refills | Status: DC

## 2019-12-03 NOTE — Telephone Encounter (Signed)
Patient was seen on yesterday & did not receive her refills. Flonase (Brand Name Only)  Singulair (Smaller Tablets, Orange?)  Patient states she was given Nasal Saline in office that could possibly be a Prescription?  Patient would like to place her Inhalers On File just in case.    [facility] [address].   Thanks

## 2019-12-03 NOTE — Telephone Encounter (Signed)
Contacted pt and left a detailed vm informing pt of cologuard results and if she has any questions or concerns or concerns to give a call

## 2019-12-03 NOTE — Telephone Encounter (Signed)
Called and spoke to patient. Per Dr. Randell Patient note we will not send in the Flonase until we get approval from the ophthalmologist and patient has been informed. Refills have been sent in. Also, I did call patient and left a detailed message letting her know that I was unable to find the nasal saline to send in for her so she can get it over the counter.

## 2019-12-14 DIAGNOSIS — H401113 Primary open-angle glaucoma, right eye, severe stage: Secondary | ICD-10-CM | POA: Diagnosis not present

## 2019-12-14 DIAGNOSIS — H40831 Aqueous misdirection, right eye: Secondary | ICD-10-CM | POA: Diagnosis not present

## 2019-12-22 ENCOUNTER — Telehealth: Payer: Self-pay | Admitting: Internal Medicine

## 2019-12-22 NOTE — Telephone Encounter (Signed)
Pt needs fu wants Jessica [name] to call back as she said that she had left a message re her well visits and she also wants results of her colo guard. Pt states these call are from a week or two back [phone]

## 2019-12-28 DIAGNOSIS — H401113 Primary open-angle glaucoma, right eye, severe stage: Secondary | ICD-10-CM | POA: Diagnosis not present

## 2019-12-28 DIAGNOSIS — H179 Unspecified corneal scar and opacity: Secondary | ICD-10-CM | POA: Diagnosis not present

## 2019-12-28 DIAGNOSIS — Z79899 Other long term (current) drug therapy: Secondary | ICD-10-CM | POA: Diagnosis not present

## 2019-12-28 DIAGNOSIS — H40831 Aqueous misdirection, right eye: Secondary | ICD-10-CM | POA: Diagnosis not present

## 2020-01-07 DIAGNOSIS — H401113 Primary open-angle glaucoma, right eye, severe stage: Secondary | ICD-10-CM | POA: Diagnosis not present

## 2020-01-07 DIAGNOSIS — H40831 Aqueous misdirection, right eye: Secondary | ICD-10-CM | POA: Diagnosis not present

## 2020-01-15 ENCOUNTER — Telehealth: Payer: Self-pay | Admitting: Internal Medicine

## 2020-01-15 NOTE — Telephone Encounter (Signed)
Copied from CRM [id number]. Topic: General - Call Back - No Documentation >> Dec 31, 2019  4:44 PM Randol Kern wrote: Reason for CRM: [phone] pt wants cologaurd results please advise, pt says that she has been waiting for weeks. >> Jan 15, 2020 12:51 PM Mcneil, Jannifer Rodney wrote: Pt stated she has requested a call back for cologuard results but not has not heard back from anyone. Pt requests call back.

## 2020-01-19 ENCOUNTER — Telehealth: Payer: Self-pay | Admitting: Allergy

## 2020-01-19 NOTE — Telephone Encounter (Signed)
Returned pt call an went over cologuard results. Pt is aware and doesn't have any questions or concerns

## 2020-01-19 NOTE — Telephone Encounter (Signed)
Patient states that Dr. Delorse Lek wanted her to call back once she talked to her PCP regarding Flonase. PCP said that Flonase was fine to take. Patient would like Flonase called into [facility] on [address] of [address]. Patient does NOT want the generic as it burns. Patient needs name brand.  Please advise.

## 2020-01-20 ENCOUNTER — Other Ambulatory Visit: Payer: Self-pay | Admitting: *Deleted

## 2020-01-20 MED ORDER — FLONASE 50 MCG/ACT NA SUSP
2.0000 | Freq: Every day | NASAL | 5 refills | Status: DC
Start: 2020-01-20 — End: 2020-01-20

## 2020-01-20 MED ORDER — FLONASE 50 MCG/ACT NA SUSP: 2.0000 | Freq: Every day | NASAL | 5 refills | Status: DC

## 2020-01-20 NOTE — Telephone Encounter (Signed)
Prescription was sent to pharmacy. 

## 2020-01-20 NOTE — Telephone Encounter (Signed)
Patient wants medication sent to [facility] on the corner of [address] and [address] instead. Please change pharmacies.

## 2020-01-20 NOTE — Telephone Encounter (Signed)
Brand name Aleda Grana has been sent in. Called patient and advised. Patient verbalized understanding.

## 2020-02-10 DIAGNOSIS — H52201 Unspecified astigmatism, right eye: Secondary | ICD-10-CM | POA: Diagnosis not present

## 2020-02-10 DIAGNOSIS — H524 Presbyopia: Secondary | ICD-10-CM | POA: Diagnosis not present

## 2020-02-10 DIAGNOSIS — H5211 Myopia, right eye: Secondary | ICD-10-CM | POA: Diagnosis not present

## 2020-02-19 ENCOUNTER — Telehealth: Payer: Self-pay | Admitting: Internal Medicine

## 2020-02-19 NOTE — Telephone Encounter (Signed)
Called pt unable to reach or leave VM/ °

## 2020-02-19 NOTE — Telephone Encounter (Signed)
Patient is calling to request a referral for Dr. Cherly Hensen in [place], [place]. [phone]

## 2020-02-23 ENCOUNTER — Ambulatory Visit: Payer: Medicare Other | Admitting: Internal Medicine

## 2020-03-10 DIAGNOSIS — H401113 Primary open-angle glaucoma, right eye, severe stage: Secondary | ICD-10-CM | POA: Diagnosis not present

## 2020-03-10 DIAGNOSIS — H40831 Aqueous misdirection, right eye: Secondary | ICD-10-CM | POA: Diagnosis not present

## 2020-03-11 DIAGNOSIS — H469 Unspecified optic neuritis: Secondary | ICD-10-CM | POA: Diagnosis not present

## 2020-03-11 DIAGNOSIS — Z961 Presence of intraocular lens: Secondary | ICD-10-CM | POA: Diagnosis not present

## 2020-03-11 DIAGNOSIS — H401113 Primary open-angle glaucoma, right eye, severe stage: Secondary | ICD-10-CM | POA: Diagnosis not present

## 2020-03-11 DIAGNOSIS — H1820 Unspecified corneal edema: Secondary | ICD-10-CM | POA: Diagnosis not present

## 2020-03-11 DIAGNOSIS — H44512 Absolute glaucoma, left eye: Secondary | ICD-10-CM | POA: Diagnosis not present

## 2020-03-31 ENCOUNTER — Other Ambulatory Visit: Payer: Self-pay | Admitting: Physician Assistant

## 2020-03-31 DIAGNOSIS — Z1211 Encounter for screening for malignant neoplasm of colon: Secondary | ICD-10-CM | POA: Diagnosis not present

## 2020-03-31 DIAGNOSIS — R1011 Right upper quadrant pain: Secondary | ICD-10-CM | POA: Diagnosis not present

## 2020-03-31 DIAGNOSIS — R1013 Epigastric pain: Secondary | ICD-10-CM

## 2020-03-31 DIAGNOSIS — R1310 Dysphagia, unspecified: Secondary | ICD-10-CM | POA: Diagnosis not present

## 2020-03-31 DIAGNOSIS — R198 Other specified symptoms and signs involving the digestive system and abdomen: Secondary | ICD-10-CM | POA: Diagnosis not present

## 2020-04-11 ENCOUNTER — Telehealth: Payer: Self-pay

## 2020-04-11 DIAGNOSIS — Z1239 Encounter for other screening for malignant neoplasm of breast: Secondary | ICD-10-CM

## 2020-04-11 NOTE — Telephone Encounter (Signed)
Copied from CRM [id number]. Topic: Referral - Status >> Apr 11, 2020  9:29 AM Marylen Ponto wrote: Reason for CRM: Pt stated she has an appt for a diagnostic mammogram that is scheduled for tomorrow morning at [facility]. Pt requests referral be placed asap. Pt also requests an update on her request for a referral to Dr. Cherly Hensen for gynecology.

## 2020-04-11 NOTE — Addendum Note (Signed)
Addended by: Jonah Blue B on: 04/11/2020 10:00 PM   Modules accepted: Orders

## 2020-04-12 NOTE — Telephone Encounter (Signed)
Called pt multiple times , left VM to call back !

## 2020-04-13 ENCOUNTER — Encounter: Payer: Self-pay | Admitting: Internal Medicine

## 2020-04-13 ENCOUNTER — Ambulatory Visit: Admitting: Allergy

## 2020-04-13 DIAGNOSIS — M8580 Other specified disorders of bone density and structure, unspecified site: Secondary | ICD-10-CM | POA: Insufficient documentation

## 2020-04-15 ENCOUNTER — Encounter: Payer: Self-pay | Admitting: Allergy

## 2020-04-15 ENCOUNTER — Ambulatory Visit (INDEPENDENT_AMBULATORY_CARE_PROVIDER_SITE_OTHER): Payer: Medicare Other | Admitting: Allergy

## 2020-04-15 ENCOUNTER — Other Ambulatory Visit: Payer: Medicare Other

## 2020-04-15 ENCOUNTER — Telehealth: Payer: Self-pay | Admitting: Internal Medicine

## 2020-04-15 ENCOUNTER — Other Ambulatory Visit: Payer: Self-pay

## 2020-04-15 VITALS — BP 100/60 | HR 89 | Resp 16

## 2020-04-15 DIAGNOSIS — T781XXD Other adverse food reactions, not elsewhere classified, subsequent encounter: Secondary | ICD-10-CM

## 2020-04-15 DIAGNOSIS — J300 Vasomotor rhinitis: Secondary | ICD-10-CM | POA: Diagnosis not present

## 2020-04-15 DIAGNOSIS — J310 Chronic rhinitis: Secondary | ICD-10-CM

## 2020-04-15 DIAGNOSIS — K9049 Malabsorption due to intolerance, not elsewhere classified: Secondary | ICD-10-CM

## 2020-04-15 MED ORDER — IPRATROPIUM BROMIDE 0.06 % NA SOLN: NASAL | 5 refills | Status: DC

## 2020-04-15 NOTE — Patient Instructions (Signed)
Non-allergic rhinitis  - environmental allergy panel was negative  - has good benefit from antihistamine and nasal steroid  - continue Allegra 180mg  daily as needed  - continue Flonase 2 sprays each nostril daily for 1-2 weeks at time for nasal congestion control  - recommend performing nasal saline rinses.  This a nasal flush of your sinuses.  Use either boiled water brought down to room temperature or use distilled water.  Breathe thru your mother the entire process  - continue Singulair 10mg  daily at bedtime  - have access to albuterol inhaler 2 puffs every 4-6 hours as needed for cough/wheeze/shortness of breath/chest tightness.  May use 15-20 minutes prior to activity.   Monitor frequency of use.    Vasomotor rhinitis  - you develop nasal drainage with eating  - try nasal Atrovent 1-2 sprays each nostril as needed for nasal drainage  Adverse food reaction   - serum IgE to foods discussed from initial including nuts, dairy, tomato, lemon, yeast, gluten are negative.  You do not have IgE mediated food allergy to these foods.    - sugar, additive and preservatives are often intolerances.  We do not have any testing available at this time for additives or preservatives thus avoidance if possible is recommended  - will obtain serum IgE to corn, pork and carrot today after symptom development with ingestion of these foods  - recommend keeping a food journal to document foods and symptoms you develop with ingestion  - we have discussed the following in regards to foods:   Allergy: food allergy is when you have eaten a food, developed an allergic reaction after eating the food and have IgE to the food (positive food testing either by skin testing or blood testing).  Food allergy could lead to life threatening symptoms  Sensitivity: occurs when you have IgE to a food (positive food testing either by skin testing or blood testing) but is a food you eat without any issues.  This is not an allergy and  we recommend keeping the food in the diet  Intolerance: this is when you have negative testing by either skin testing or blood testing thus not allergic but the food causes symptoms (like belly pain, bloating, diarrhea etc) with ingestion.  These foods should be avoided to prevent symptoms.    Nummular eczema  - continue your topical steroid cream as directed by your dermatologist  - daily moisturization   Follow-up 4-6 months or sooner if needed   **We are ordering labs, so please allow 1-2 weeks for the results to come back.  With the newly implemented Cures Act, the labs might be visible to you at the same time that they become visible to me.  However, I will not address the results until all of the results come  back, so please be patient.  In the meantime, continue avoiding your triggering food(s) in your After Visit Summary, including avoidance measures (if applicable), until you hear from me about the results.

## 2020-04-15 NOTE — Telephone Encounter (Signed)
Will forward to pcp to place order for Mammogram

## 2020-04-15 NOTE — Progress Notes (Signed)
Follow-up Note  RE: Jessica [name] MRN: [id number] DOB: 06/20/1955 Date of Office Visit: 04/15/2020   History of present illness: Jessica [name] is a 63 y.o. female presenting today for follow-up of non-allergic rhinitis and adverse food reaction/food intolerance.  She was last seen in the office on 12/02/19 by myself. She states she did improve from the sinus infection she had at last visit.  She has continued to use allegra and singulair daily as well as flonase.  She did discuss with her ophthalmologist flonse use who per pt advised ok to use.  She states she can tell a different in symptoms when she forgets and misses a dose.  She notes improvement in congestion however she states she still notes drainage and lump in throat when she eats.  She also states when she was out of state she was eating with some [place] people and had [place] foods consisting of corn soup, corn cakes and cheese bread.  She states the corn soup had pork and maybe carrots in it.  After eating these foods she states within the hour she felt a spasm in her mid back and felt like her "lungs hurt" and a itchy rash on her hand.    Review of systems in the last 4 weeks: Review of Systems  Constitutional: Negative.   HENT: Negative.   Eyes: Negative.   Respiratory: Negative.   Cardiovascular: Negative.   Gastrointestinal: Negative.   Musculoskeletal: Negative.   Skin: Negative.   Neurological: Negative.     All other systems negative unless noted above in HPI  Past medical/social/surgical/family history have been reviewed and are unchanged unless specifically indicated below.  No changes  Medication List: Current Outpatient Medications  Medication Sig Dispense Refill   albuterol (VENTOLIN HFA) 108 (90 Base) MCG/ACT inhaler Inhale 2 puffs into the lungs every 4 (four) hours as needed. 18 g 2   Cholecalciferol (VITAMIN D3) 5000 units TABS Take 5,000 Units by mouth daily.     Co-Enzyme Q-10 100 MG CAPS Take 100  mg by mouth daily.     docusate sodium (COLACE) 100 MG capsule Take 100 mg by mouth 2 (two) times daily.     FLONASE 50 MCG/ACT nasal spray Place 2 sprays into both nostrils daily. 16 g 5   fluticasone (FLONASE) 50 MCG/ACT nasal spray Place 2 sprays into both nostrils daily.     fluticasone (FLONASE) 50 MCG/ACT nasal spray Place 2 sprays into both nostrils daily. 16 g 3   latanoprost (XALATAN) 0.005 % ophthalmic solution Place 1 drop into the right eye at bedtime.      loratadine (CLARITIN) 10 MG tablet Take 10 mg by mouth daily.      Magnesium Gluconate 500 (27 Mg) MG TABS Take 500 mg by mouth daily.     montelukast (SINGULAIR) 10 MG tablet Take 1 tablet (10 mg total) by mouth at bedtime. 90 tablet 1   Multiple Vitamins-Minerals (MULTIVITAMIN WOMEN PO) Take 1 tablet by mouth daily.     naproxen sodium (ALEVE) 220 MG tablet Take 220 mg by mouth daily as needed.     Omega-3 Fatty Acids (FISH OIL) 1200 MG CAPS Take 1,200 mg by mouth daily.     Polyethyl Glycol-Propyl Glycol 0.4-0.3 % SOLN Place 1 drop into both eyes daily.      Thiamine HCl (VITAMIN B-1) 250 MG tablet Take 250 mg by mouth 2 (two) times daily.     timolol (TIMOPTIC) 0.5 % ophthalmic solution Place 1 drop  into the right eye 2 (two) times daily.      ipratropium (ATROVENT) 0.06 % nasal spray Use 1-2 sprays in each nostril as needed for nasal drainage 15 mL 5   No current facility-administered medications for this visit.     Known medication allergies: Allergies  Allergen Reactions   Motrin [Ibuprofen] Swelling    Tongue swells   Sulfa Antibiotics Swelling    Tongue swells   Beta Adrenergic Blockers Other (See Comments)    Eye infection (only with eye drops), shortness of breath   Contrast Media [Iodinated Diagnostic Agents] Other (See Comments)    Only use non-ionic solutions per md   Gluten Meal Other (See Comments)    Rectal itching, intestinal inflammation   Macrobid [Nitrofurantoin Monohyd Macro]  Itching   Oxycodone-Acetaminophen Other (See Comments)    hallucinations   Oxycodone-Aspirin Other (See Comments)    hallucinations   Sudafed [Pseudoephedrine Hcl]     ?swollen tongue   Ultram [Tramadol] Other (See Comments)    hallucinations   Wheat Bran Other (See Comments)    Rectal itching and burning inside of body (particularly on right side)   Sugar-Protein-Starch Rash and Other (See Comments)    Foggy thoughts, blood sugar fluctuates excessively   Yeast-Related Products Rash     Physical examination: Blood pressure 100/60, pulse 89, resp. rate 16, SpO2 98 %.  General: Alert, interactive, in no acute distress. HEENT: PERRLA, TMs pearly gray, turbinates non-edematous without discharge, post-pharynx non erythematous. Neck: Supple without lymphadenopathy. Lungs: Clear to auscultation without wheezing, rhonchi or rales. {no increased work of breathing. CV: Normal S1, S2 without murmurs. Abdomen: Nondistended, nontender. Skin: Warm and dry, without lesions or rashes. Extremities:  No clubbing, cyanosis or edema. Neuro:   Grossly intact.  Diagnositics/Labs: None today  Assessment and plan:   Non-allergic rhinitis  - environmental allergy panel was negative  - has good benefit from antihistamine and nasal steroid  - continue Allegra 180mg  daily as needed  - continue Flonase 2 sprays each nostril daily for 1-2 weeks at time for nasal congestion control  - recommend performing nasal saline rinses.  This a nasal flush of your sinuses.  Use either boiled water brought down to room temperature or use distilled water.  Breathe thru your mother the entire process  - continue Singulair 10mg  daily at bedtime  - have access to albuterol inhaler 2 puffs every 4-6 hours as needed for cough/wheeze/shortness of breath/chest tightness.  May use 15-20 minutes prior to activity.   Monitor frequency of use.    Vasomotor rhinitis  - you develop nasal drainage with eating  - try  nasal Atrovent 1-2 sprays each nostril as needed for nasal drainage  Adverse food reaction/intolerance  - serum IgE to foods discussed from initial including nuts, dairy, tomato, lemon, yeast, gluten are negative.  You do not have IgE mediated food allergy to these foods.    - sugar, additive and preservatives are often intolerances.  We do not have any testing available at this time for additives or preservatives thus avoidance if possible is recommended  - will obtain serum IgE to corn, pork and carrot today after symptom development with ingestion of these foods  - recommend keeping a food journal to document foods and symptoms you develop with ingestion  - we have discussed the following in regards to foods:   Allergy: food allergy is when you have eaten a food, developed an allergic reaction after eating the food and have IgE  to the food (positive food testing either by skin testing or blood testing).  Food allergy could lead to life threatening symptoms  Sensitivity: occurs when you have IgE to a food (positive food testing either by skin testing or blood testing) but is a food you eat without any issues.  This is not an allergy and we recommend keeping the food in the diet  Intolerance: this is when you have negative testing by either skin testing or blood testing thus not allergic but the food causes symptoms (like belly pain, bloating, diarrhea etc) with ingestion.  These foods should be avoided to prevent symptoms.    Nummular eczema  - continue your topical steroid cream as directed by your dermatologist  - daily moisturization   Follow-up 4-6 months or sooner if needed  I appreciate the opportunity to take part in Afsheen's care. Please do not hesitate to contact me with questions.  Sincerely,   Margo Aye, MD Allergy/Immunology [facility]

## 2020-04-15 NOTE — Telephone Encounter (Signed)
Patient came in to schedule an appointment for review of lab results that she received at the nutritionist. Also would like a diagnostic mammogram order sent today because she has a mammogram scheduled for Monday. Please advise.

## 2020-04-18 ENCOUNTER — Telehealth: Payer: Self-pay

## 2020-04-18 ENCOUNTER — Ambulatory Visit: Payer: Medicare Other | Admitting: Pharmacist

## 2020-04-18 DIAGNOSIS — Z1231 Encounter for screening mammogram for malignant neoplasm of breast: Secondary | ICD-10-CM | POA: Diagnosis not present

## 2020-04-18 LAB — ALLERGEN, PORK, F26: Pork IgE: <0.1 kU/L

## 2020-04-18 LAB — ALLERGEN CARROT: Allergen Carrot IgE: <0.1 kU/L

## 2020-04-18 LAB — ALLERGEN, CORN F8: Allergen Corn, IgE: <0.1 kU/L

## 2020-04-18 NOTE — Telephone Encounter (Signed)
Pt returning a call from the clinic.

## 2020-04-18 NOTE — Telephone Encounter (Signed)
Contacted pt to go over Dr. Johnson response pt didn't answer lvm  

## 2020-04-19 ENCOUNTER — Other Ambulatory Visit: Payer: Self-pay

## 2020-04-19 ENCOUNTER — Ambulatory Visit: Payer: Medicare Other | Attending: Internal Medicine | Admitting: Internal Medicine

## 2020-04-19 ENCOUNTER — Encounter: Payer: Self-pay | Admitting: Internal Medicine

## 2020-04-19 DIAGNOSIS — E782 Mixed hyperlipidemia: Secondary | ICD-10-CM | POA: Diagnosis not present

## 2020-04-19 DIAGNOSIS — H469 Unspecified optic neuritis: Secondary | ICD-10-CM | POA: Diagnosis not present

## 2020-04-19 DIAGNOSIS — H409 Unspecified glaucoma: Secondary | ICD-10-CM

## 2020-04-19 DIAGNOSIS — Z2821 Immunization not carried out because of patient refusal: Secondary | ICD-10-CM | POA: Diagnosis not present

## 2020-04-19 DIAGNOSIS — R945 Abnormal results of liver function studies: Secondary | ICD-10-CM | POA: Diagnosis not present

## 2020-04-19 DIAGNOSIS — D7589 Other specified diseases of blood and blood-forming organs: Secondary | ICD-10-CM

## 2020-04-19 DIAGNOSIS — R7989 Other specified abnormal findings of blood chemistry: Secondary | ICD-10-CM

## 2020-04-19 NOTE — Progress Notes (Signed)
Virtual Visit via Telephone Note  I connected with Jessica [name] on 04/19/20 at 10:18 a.m by telephone and verified that I am speaking with the correct person using two identifiers.  Location: Patient: home Provider: office  Myself, my CMA Julius Bowels and patient participated in this encounter I discussed the limitations, risks, security and privacy concerns of performing an evaluation and management service by telephone and the availability of in person appointments. I also discussed with the patient that there may be a patient responsible charge related to this service. The patient expressed understanding and agreed to proceed.   History of Present Illness: Pt with hx of glaucoma BL with optic neuropathy, HL, non-allergic rhinitis, vasomotor rhinitis.  Patient requesting some assistance at home with ADLs due to poor vision.  She has problems handling and returning phone calls.  Her mailbox is full and she has a hard time trying to figure out how to listen to and delete messages so sometimes she misses important phone calls.  She also has problems sending on receiving emails.  She has problems cooking.  She states that sometimes food and drops down into the burner which she is unable to see and this causes increased smoke in the kitchen/house.  Her husband has tried and doing a lot of the household chores but he recently had cataract surgery and has health issues of his own.  He is 64 years old.  He is no longer able to keep up with it himself.  Her children did do a go fund me for her that paid for some home services once every 2 weeks for several weeks but that is no longer an option.  She knows that Medicare does not pay for [facility] services.  However she is inquiring whether services for the blind offer any type of help with PCS services.  She is also requesting me to go over her labs with her that were drawn 03/10/2020 by her chiropractor.  He ordered a panel of tests including chemistry LDL levels, iron  levels, cholesterol, thyroid hormone panel, C-reactive protein, homocystine, fibrinogen, vitamin D level and CBC.  She tells me that her chiropractor also deals with nutrition and she went to him and requested that she be checked for as much things as possible myself and the endocrinologist have not detected. -Based on the results of the labs, she reports that the chiropractor told her that she is full of inflammation in her body and her lungs and liver are being overstressed.   Outpatient Encounter Medications as of 04/19/2020  Medication Sig Note  . albuterol (VENTOLIN HFA) 108 (90 Base) MCG/ACT inhaler Inhale 2 puffs into the lungs every 4 (four) hours as needed.   . Cholecalciferol (VITAMIN D3) 5000 units TABS Take 5,000 Units by mouth daily.   Marland Kitchen Co-Enzyme Q-10 100 MG CAPS Take 100 mg by mouth daily.   Marland Kitchen docusate sodium (COLACE) 100 MG capsule Take 100 mg by mouth 2 (two) times daily. 12/02/2019: As needed  . FLONASE 50 MCG/ACT nasal spray Place 2 sprays into both nostrils daily.   . fluticasone (FLONASE) 50 MCG/ACT nasal spray Place 2 sprays into both nostrils daily.   . fluticasone (FLONASE) 50 MCG/ACT nasal spray Place 2 sprays into both nostrils daily.   Marland Kitchen ipratropium (ATROVENT) 0.06 % nasal spray Use 1-2 sprays in each nostril as needed for nasal drainage   . latanoprost (XALATAN) 0.005 % ophthalmic solution Place 1 drop into the right eye at bedtime.  12/31/2013: .  Marland Kitchen  loratadine (CLARITIN) 10 MG tablet Take 10 mg by mouth daily.    . Magnesium Gluconate 500 (27 Mg) MG TABS Take 500 mg by mouth daily.   . montelukast (SINGULAIR) 10 MG tablet Take 1 tablet (10 mg total) by mouth at bedtime.   . Multiple Vitamins-Minerals (MULTIVITAMIN WOMEN PO) Take 1 tablet by mouth daily.   . naproxen sodium (ALEVE) 220 MG tablet Take 220 mg by mouth daily as needed. 12/02/2019: As needed  . Omega-3 Fatty Acids (FISH OIL) 1200 MG CAPS Take 1,200 mg by mouth daily.   Bertram Gala Glycol-Propyl Glycol 0.4-0.3  % SOLN Place 1 drop into both eyes daily.    . Thiamine HCl (VITAMIN B-1) 250 MG tablet Take 250 mg by mouth 2 (two) times daily.   . timolol (TIMOPTIC) 0.5 % ophthalmic solution Place 1 drop into the right eye 2 (two) times daily.     No facility-administered encounter medications on file as of 04/19/2020.      Observations/Objective: I reviewed the labs.  Her kidney in particular the GFR was normal.  LFTs revealed mild elevation in AST and ALT of 46 and 40.  LDL was elevated mildly at 212, ferritin level is 225, Thyroid panel revealed normal TSH, mildly low free T3 and normal T4.  Vitamin D level is normal.  C-reactive protein level normal.  Homocystine level elevated at 12.6. CBC revealed a hemoglobin and hematocrit that were normal but an MCV of 93.  He did have on the side of hematocrit and hemoglobin that these were female levels.    Assessment and Plan: 1. Glaucoma of both eyes, unspecified glaucoma type 2. Optic neuropathy of both eyes Patient reporting increased vision problems affecting her abilities to perform ADLs effectively. -We will have case worker check with services for the blind to see whether any PCS services are offered through the organization.  3. Mixed hyperlipidemia I would like to repeat some of the lab tests that were done to check for accuracy.  Her last LDL that we checked was in the 130s.  The one checked by her chiropractor revealed LDL of 237.  Discussed healthy eating habits and regular exercise to help lower cholesterol.  Patient not interested in medications at this time. - Lipid panel; Future  4. Abnormal LFTs I would like to repeat LFTs to check for accuracy.  Further management will be based on results. - Hepatic Function Panel; Future  5. Influenza vaccination declined This was recommended.  Patient declined stating that she did not tolerate flu shot in the past.  6. Macrocytosis without anemia - CBC; Future - Vitamin B12; Future - Folate;  Future   Follow Up Instructions:    I discussed the assessment and treatment plan with the patient. The patient was provided an opportunity to ask questions and all were answered. The patient agreed with the plan and demonstrated an understanding of the instructions.   The patient was advised to call back or seek an in-person evaluation if the symptoms worsen or if the condition fails to improve as anticipated.  I provided 20 minutes of non-face-to-face time during this encounter.   Jonah Blue, MD

## 2020-04-19 NOTE — Progress Notes (Signed)
Pt is wanting to go over lab work that she dropped off  Pt states her chiropractor did the blood work   Pt states she is needing some type of assistance to help in the home

## 2020-04-19 NOTE — Patient Instructions (Signed)

## 2020-04-20 ENCOUNTER — Ambulatory Visit: Payer: Medicare Other | Attending: Internal Medicine

## 2020-04-20 ENCOUNTER — Other Ambulatory Visit: Payer: Self-pay

## 2020-04-20 DIAGNOSIS — E782 Mixed hyperlipidemia: Secondary | ICD-10-CM | POA: Diagnosis not present

## 2020-04-20 DIAGNOSIS — R945 Abnormal results of liver function studies: Secondary | ICD-10-CM

## 2020-04-20 DIAGNOSIS — D7589 Other specified diseases of blood and blood-forming organs: Secondary | ICD-10-CM | POA: Diagnosis not present

## 2020-04-20 DIAGNOSIS — R7989 Other specified abnormal findings of blood chemistry: Secondary | ICD-10-CM

## 2020-04-21 ENCOUNTER — Telehealth: Payer: Self-pay

## 2020-04-21 LAB — FOLATE: Folate: 14.4 ng/mL

## 2020-04-21 LAB — CBC
Hematocrit: 40 % (ref 34.0–46.6)
Hemoglobin: 13.3 g/dL (ref 11.1–15.9)
MCH: 30.2 pg (ref 26.6–33.0)
MCHC: 33.3 g/dL (ref 31.5–35.7)
MCV: 91 fL (ref 79–97)
Platelets: 233 x10E3/uL (ref 150–450)
RBC: 4.4 x10E6/uL (ref 3.77–5.28)
RDW: 12.8 % (ref 11.7–15.4)
WBC: 5.7 x10E3/uL (ref 3.4–10.8)

## 2020-04-21 LAB — HEPATIC FUNCTION PANEL
ALT: 28 IU/L (ref 0–32)
AST: 30 IU/L (ref 0–40)
Albumin: 4.3 g/dL (ref 3.8–4.8)
Alkaline Phosphatase: 104 IU/L (ref 44–121)
Bilirubin Total: 0.4 mg/dL (ref 0.0–1.2)
Bilirubin, Direct: 0.15 mg/dL (ref 0.00–0.40)
Total Protein: 6.6 g/dL (ref 6.0–8.5)

## 2020-04-21 LAB — LIPID PANEL
Chol/HDL Ratio: 2.9 ratio (ref 0.0–4.4)
Cholesterol, Total: 284 mg/dL — ABNORMAL HIGH (ref 100–199)
HDL: 98 mg/dL (ref >39)
LDL Chol Calc (NIH): 179 mg/dL — ABNORMAL HIGH (ref 0–99)
Triglycerides: 52 mg/dL (ref 0–149)
VLDL Cholesterol Cal: 7 mg/dL (ref 5–40)

## 2020-04-21 LAB — VITAMIN B12: Vitamin B-12: 570 pg/mL (ref 232–1245)

## 2020-04-21 NOTE — Telephone Encounter (Signed)
Message received from Dr Laural Benes noting patient is requesting assistance with ADLs but she does not have medicaid.  She has Medicare and [employer].   Call placed to [facility] of [facility] # [phone]. Message left for Maretta Bees, SW requesting a call back to this CM.

## 2020-04-22 ENCOUNTER — Telehealth: Payer: Self-pay

## 2020-04-22 NOTE — Telephone Encounter (Signed)
Contacted pt to go over lab results pt didn't answer lvm  

## 2020-05-02 ENCOUNTER — Other Ambulatory Visit: Payer: Self-pay | Admitting: Allergy

## 2020-05-03 ENCOUNTER — Other Ambulatory Visit: Payer: Self-pay | Admitting: Allergy

## 2020-05-05 ENCOUNTER — Telehealth: Payer: Self-pay

## 2020-05-05 NOTE — Telephone Encounter (Signed)
Message received from Darrol Angel, SW/ [facility].and call returned to her # [phone] to inquire about programs for providing assistance with ADLS for individuals without medicaid. Message left with call back requested to this CM

## 2020-06-08 ENCOUNTER — Telehealth: Payer: Self-pay | Admitting: Internal Medicine

## 2020-06-08 NOTE — Telephone Encounter (Signed)
Contacted pt to go over Dr. Johnson message pt is aware and doesn't have any questions or concerns  

## 2020-07-04 ENCOUNTER — Encounter: Payer: Self-pay | Admitting: Internal Medicine

## 2020-08-17 ENCOUNTER — Telehealth: Payer: Self-pay | Admitting: Allergy

## 2020-08-17 MED ORDER — FLUTICASONE PROPIONATE 50 MCG/ACT NA SUSP: NASAL | 0 refills | Status: DC

## 2020-08-17 MED ORDER — MONTELUKAST SODIUM 10 MG PO TABS: ORAL_TABLET | ORAL | 0 refills | Status: DC

## 2020-08-17 NOTE — Telephone Encounter (Signed)
Refills sent in to [facility] by med I did put in description to dispense brand only Flonase 50 mcg

## 2020-08-17 NOTE — Telephone Encounter (Signed)
Last seen 04/15/20 and has an appointment on 09/16/20.

## 2020-08-17 NOTE — Telephone Encounter (Signed)
Patient is requesting a refill for Montelukast and Flonase. She said it has to be the brand name for Flonase, Not generic, it does not work she also said with the Flonase it has to specify 50 mcg otherwise they will not cover it. She has changed pharmacies. She now uses [facility] and it is a Energy manager. She was not sure of the name. She thought it might be [facility] or mail by meds?

## 2020-08-22 DIAGNOSIS — H5440 Blindness, one eye, unspecified eye: Secondary | ICD-10-CM | POA: Insufficient documentation

## 2020-09-02 ENCOUNTER — Ambulatory Visit
Admission: RE | Admit: 2020-09-02 | Discharge: 2020-09-02 | Disposition: A | Discharge status: Home / Self Care | Payer: Medicare (Managed Care) | Source: Ambulatory Visit | Attending: Family | Admitting: Family

## 2020-09-02 ENCOUNTER — Telehealth: Payer: Self-pay | Admitting: Family

## 2020-09-02 ENCOUNTER — Other Ambulatory Visit: Payer: Self-pay

## 2020-09-02 ENCOUNTER — Encounter: Payer: Self-pay | Admitting: Family

## 2020-09-02 ENCOUNTER — Ambulatory Visit (INDEPENDENT_AMBULATORY_CARE_PROVIDER_SITE_OTHER): Payer: Medicare (Managed Care) | Admitting: Family

## 2020-09-02 DIAGNOSIS — J019 Acute sinusitis, unspecified: Secondary | ICD-10-CM | POA: Diagnosis not present

## 2020-09-02 DIAGNOSIS — R059 Cough, unspecified: Secondary | ICD-10-CM

## 2020-09-02 DIAGNOSIS — J310 Chronic rhinitis: Secondary | ICD-10-CM | POA: Diagnosis not present

## 2020-09-02 MED ORDER — AMOXICILLIN-POT CLAVULANATE 875-125 MG PO TABS: ORAL_TABLET | ORAL | 0 refills | Status: DC

## 2020-09-02 MED ORDER — PREDNISONE 10 MG PO TABS: ORAL_TABLET | ORAL | 0 refills | Status: DC

## 2020-09-02 NOTE — Telephone Encounter (Signed)
Noted! Thank you

## 2020-09-02 NOTE — Telephone Encounter (Signed)
Jessica Settle, FNP is aware patient can take prednisone as long patient isn't taking it for more than 14 days. Patient is only going to take prednisone 10 mg one tablet twice a day for 4 days, then 1 tablet on day 5, then stop.

## 2020-09-02 NOTE — Telephone Encounter (Signed)
Per Patients Eye Doctor's Nurse Clydie Braun at [facility] patient can complete a round of Predisone but nothing over 14 days

## 2020-09-02 NOTE — Progress Notes (Signed)
Please let Jessica [name] know that her chest xray is normal.  Also,, please let her know that we have left a message with her eye doctor to see if they will okay her having prednisone.  We have sent in a prescription for Augmentin 875 mg taking 1 tablet twice a day for 10 days.  When I spoke with her on the phone during the visit she said that she has taken this medication in the past and has not had any problems.  Please verify this.  Also please let her know to keep her already scheduled follow-up appointment with Dr. Delorse Lek on Sep 16, 2020.

## 2020-09-02 NOTE — Patient Instructions (Addendum)
Non-allergic rhinitis  - Start Augmentin 875 mg 1 tablet twice a day for 10 days -Start prednisone 10 mg taking 1 tablet twice a day for 4 days, then on the fifth day take 1 tablet and stop.  - continue Zyrtec 10 mg daily as needed  - continue Flonase 2 sprays each nostril daily for 1-2 weeks at time for nasal congestion control  - Start nasal saline rinses.  This a nasal flush of your sinuses.  Use either boiled water brought down to room temperature or use distilled water.  Breathe thru your mother the entire process  - continue Singulair 10mg  daily at bedtime  - have access to albuterol inhaler 2 puffs every 4-6 hours as needed for cough/wheeze/shortness of breath/chest tightness.  May use 15-20 minutes prior to activity.   Monitor frequency of use.   Cough Get stat PA and lateral chest x-ray  Keep already scheduled appointment on May 6 at 10 AM with Dr. May 8

## 2020-09-02 NOTE — Telephone Encounter (Signed)
Follow up call with pt. Pt. Was informed that her chest xray was normal. She was diagnosed with a sinus infection today. Pt. Hasn't had any problems taking augmentin 875 mg or the prednisone in the past. Pt. Wanted to know if she should take a probiotic and or yogurt and how much. Addressed with patient that is a question for her primary care physician but with being on an antibiotic and having a history of cdiff it wouldn't hurt to take a probiotic and some yogurt. Reminded patient also to keep her appointment with Dr. Delorse Lek May 6th

## 2020-09-02 NOTE — Progress Notes (Addendum)
RE: Meyer Dockery MRN: [id number] DOB: 03/26/1956 Date of Telemedicine Visit: 09/02/2020  Referring provider: Marcine Matar, MD Primary care provider: Marcine Matar, MD  Chief Complaint: Cough and Sinus Problem   Telemedicine Follow Up Visit via Telephone: I connected with Jessica [name] for a follow up on 09/02/20 by telephone and verified that I am speaking with the correct person using two identifiers.   I discussed the limitations, risks, security and privacy concerns of performing an evaluation and management service by telephone and the availability of in person appointments. I also discussed with the patient that there may be a patient responsible charge related to this service. The patient expressed understanding and agreed to proceed.  Patient is at home.  Provider is at the office.  Visit start time: 9:35 AM Visit end time: 10:24 AM Insurance consent/check in by: Roanna Raider B Medical consent and medical assistant/nurse: Thressa Sheller History of Present Illness: She is a 65 y.o. female, who is being followed for nonallergic rhinitis, vasomotor rhinitis, adverse food reaction/intolerance. Her previous allergy office visit was on April 15, 2020 with Dr. Delorse Lek.   Nonallergic rhinitis is reported as not well controlled with Zyrtec 10 mg once a day and Flonase 2 sprays each nostril once a day.  She reports that approximately for the past 2 weeks she has had headaches, frontal pressure, rhinorrhea, nasal congestion, postnasal drip, and pressure in both of her ears.  She is unaware of the color of her rhinorrhea because she has a "slight affliction".  She asked her husband to take a look and he does not remember the color.  She denies any fever or chills, but mentions that she had chills last week.  She does mention that she is able to feel a lymph node on the right side of her neck but it is smaller than what it was.  She also has noticed the lymph nodes in her groin.  She also mentions  that her throat is sore from coughing.She reports that she can take Augmentin with no problem.  She reports that she is not certain if she is allowed to have steroids due to the pressure in her eyes.  She would like for [place] to call her eye doctor, Dr. Reginia Naas to get approval.  She also mentions that what was in her head has moved to her chest light last week or early this week.  She started coughing so she started using her albuterol.  She reports that when she used the albuterol it made her coughing easier and it started breaking up her cough.  Her cough was reported as dry in the beginning, then became productive with the use of the inhaler and is now dry.  She is not sure of the color of her sputum due to her site affliction.  She did not have her husband look at the color of her sputum.  She denies any wheezing, shortness of breath , fever, or chills.  She has been using her albuterol every 4 hours for the cough.  Assessment and Plan: Jessica [name] is a 65 y.o. female with: Patient Instructions  Non-allergic rhinitis  - Start Augmentin 875 mg 1 tablet twice a day for 10 days -Start prednisone 10 mg taking 1 tablet twice a day for 4 days, then on the fifth day take 1 tablet and stop.  - continue Zyrtec 10 mg daily as needed  - continue Flonase 2 sprays each nostril daily for 1-2 weeks at time for nasal  congestion control  - Start nasal saline rinses.  This a nasal flush of your sinuses.  Use either boiled water brought down to room temperature or use distilled water.  Breathe thru your mother the entire process  - continue Singulair 10mg  daily at bedtime  - have access to albuterol inhaler 2 puffs every 4-6 hours as needed for cough/wheeze/shortness of breath/chest tightness.  May use 15-20 minutes prior to activity.   Monitor frequency of use.   Cough Get stat PA and lateral chest x-ray  Keep already scheduled appointment on May 6 at 10 AM with Dr. May 8         Return in about 2 weeks  (around 09/16/2020), or if symptoms worsen or fail to improve.  Meds ordered this encounter  Medications   amoxicillin-clavulanate (AUGMENTIN) 875-125 MG tablet    Sig: Take 1 tablet twice a day for 10 days for infection    Dispense:  20 tablet    Refill:  0   predniSONE (DELTASONE) 10 MG tablet    Sig: Take one tablet by mouth twice a day for 4 days, then 1 tablet on day 5, then stop    Dispense:  9 tablet    Refill:  0   Lab Orders  No laboratory test(s) ordered today    Diagnostics: None.  Medication List:  Current Outpatient Medications  Medication Sig Dispense Refill   albuterol (VENTOLIN HFA) 108 (90 Base) MCG/ACT inhaler INHALE 2 PUFFS INTO THE LUNGS EVERY 4 HOURS AS NEEDED     amoxicillin-clavulanate (AUGMENTIN) 875-125 MG tablet Take 1 tablet twice a day for 10 days for infection 20 tablet 0   atropine 1 % ophthalmic solution Place 1 drop into the right eye daily.     b complex vitamins capsule Take 1 capsule by mouth daily.     Calcium Carb-Cholecalciferol 250-125 MG-UNIT TABS Take by mouth.     cetirizine (ZYRTEC) 10 MG tablet Take 10 mg by mouth daily.     Cholecalciferol (VITAMIN D3) 5000 units TABS Take 5,000 Units by mouth daily.     FLONASE 50 MCG/ACT nasal spray Place 2 sprays into both nostrils daily. 16 g 5   Magnesium Gluconate 500 (27 Mg) MG TABS Take 500 mg by mouth daily.     montelukast (SINGULAIR) 10 MG tablet TAKE 1 TABLET(10 MG) BY MOUTH AT BEDTIME 30 tablet 0   Multiple Vitamins-Minerals (MULTIVITAMIN WOMEN PO) Take 1 tablet by mouth daily.     naproxen sodium (ALEVE) 220 MG tablet Take 220 mg by mouth daily as needed.     Omega-3 Fatty Acids (FISH OIL) 1200 MG CAPS Take 1,200 mg by mouth daily.     Polyethyl Glycol-Propyl Glycol (SYSTANE FREE OP) Apply to eye.     predniSONE (DELTASONE) 10 MG tablet Take one tablet by mouth twice a day for 4 days, then 1 tablet on day 5, then stop 9 tablet 0   No current facility-administered medications for this  visit.   Allergies: Allergies  Allergen Reactions   Motrin [Ibuprofen] Swelling    Tongue swells   Sulfa Antibiotics Swelling    Tongue swells   Beta Adrenergic Blockers Other (See Comments)    Eye infection (only with eye drops), shortness of breath   Contrast Media [Iodinated Diagnostic Agents] Other (See Comments)    Only use non-ionic solutions per md   Dorzolamide Hcl-Timolol Mal Other (See Comments)    Burning sensation   Gluten Meal Other (See Comments)  Rectal itching, intestinal inflammation   Macrobid [Nitrofurantoin Monohyd Macro] Itching   Oxycodone-Acetaminophen Other (See Comments)    hallucinations   Oxycodone-Aspirin Other (See Comments)    hallucinations   Sudafed [Pseudoephedrine Hcl]     ?swollen tongue   Ultram [Tramadol] Other (See Comments)    hallucinations   Wheat Bran Other (See Comments)    Rectal itching and burning inside of body (particularly on right side)   Sugar-Protein-Starch Rash and Other (See Comments)    Foggy thoughts, blood sugar fluctuates excessively   Yeast-Related Products Rash   I reviewed her past medical history, social history, family history, and environmental history and no significant changes have been reported from previous visit on April 15, 2020.  Review of Systems  Constitutional: Negative for chills and fatigue.  HENT: Positive for congestion, ear pain, postnasal drip, rhinorrhea, sinus pressure and sore throat.        Sore throat from coughing  Eyes:       Reports itchy watery eyes  Respiratory: Positive for cough and chest tightness. Negative for shortness of breath and wheezing.   Cardiovascular: Negative for chest pain.  Genitourinary: Negative for difficulty urinating.  Allergic/Immunologic: Negative for environmental allergies.  Neurological: Negative for headaches.   Objective: Physical Exam Not obtained as encounter was done via telephone.   Previous notes and tests were reviewed.  I discussed the  assessment and treatment plan with the patient. The patient was provided an opportunity to ask questions and all were answered. The patient agreed with the plan and demonstrated an understanding of the instructions.   The patient was advised to call back or seek an in-person evaluation if the symptoms worsen or if the condition fails to improve as anticipated.  I provided 39 minutes of non-face-to-face time during this encounter.  It was my pleasure to participate in [place]ou's care today. Please feel free to contact me with any questions or concerns.   Sincerely,  Nehemiah Settle, FNP  I have provided oversight concerning Jessica [name]' evaluation and treatment of this patient's health issues addressed during today's encounter. I agree with the assessment and therapeutic plan as outlined in the note.   Signed,   Jessica Priest, MD,  Allergy and Immunology,  [facility].

## 2020-09-02 NOTE — Telephone Encounter (Signed)
Patient had a televisit today, 09/02/20,  with Chrissie. Patient forgot to tell Chrissie that she cannot have any prescriptions with sugar in them. I sent Chrissie a Teams message about this, but also told her I would send a phone message so it would be documented.

## 2020-09-02 NOTE — Telephone Encounter (Signed)
I tried calling pt regarding lab results- left a voicemail for pt to return my call.    Nehemiah Settle, FNP  09/02/2020 2:27 PM EDT      Please let Jessica [name] know that her chest xray is normal. Also,, please let her know that we have left a message with her eye doctor to see if they will okay her having prednisone. We have sent in a prescription for Augmentin 875 mg taking 1 tablet twice a day for 10 days. When I spoke with her on the phone during the visit she said that she has taken this medication in the past and has not had any problems. Please verify this. Also please let her know to keep her already scheduled follow-up appointment with Dr. Delorse Lek on Sep 16, 2020.    Also per Chrissie the only medication that was sent was Augmentin and that does not contain sugar.

## 2020-09-05 ENCOUNTER — Ambulatory Visit: Admitting: Allergy

## 2020-09-05 ENCOUNTER — Other Ambulatory Visit: Payer: Self-pay

## 2020-09-16 ENCOUNTER — Ambulatory Visit: Admitting: Allergy

## 2021-02-17 ENCOUNTER — Other Ambulatory Visit: Payer: Self-pay | Admitting: Physician Assistant

## 2021-02-17 DIAGNOSIS — R1011 Right upper quadrant pain: Secondary | ICD-10-CM

## 2021-03-09 ENCOUNTER — Ambulatory Visit
Admission: RE | Admit: 2021-03-09 | Discharge: 2021-03-09 | Disposition: A | Discharge status: Home / Self Care | Payer: Medicare Other | Source: Ambulatory Visit | Attending: Physician Assistant | Admitting: Physician Assistant

## 2021-03-09 ENCOUNTER — Other Ambulatory Visit: Payer: Self-pay

## 2021-03-09 DIAGNOSIS — R1011 Right upper quadrant pain: Secondary | ICD-10-CM

## 2021-05-02 ENCOUNTER — Other Ambulatory Visit: Payer: Self-pay | Admitting: *Deleted

## 2021-05-02 DIAGNOSIS — I8390 Asymptomatic varicose veins of unspecified lower extremity: Secondary | ICD-10-CM

## 2021-05-18 ENCOUNTER — Ambulatory Visit (INDEPENDENT_AMBULATORY_CARE_PROVIDER_SITE_OTHER): Payer: Medicare Other | Admitting: Physician Assistant

## 2021-05-18 ENCOUNTER — Other Ambulatory Visit: Payer: Self-pay

## 2021-05-18 ENCOUNTER — Ambulatory Visit (HOSPITAL_COMMUNITY)
Admission: RE | Admit: 2021-05-18 | Discharge: 2021-05-18 | Disposition: A | Discharge status: Home / Self Care | Payer: Medicare Other | Source: Ambulatory Visit | Attending: Vascular Surgery | Admitting: Vascular Surgery

## 2021-05-18 VITALS — BP 100/63 | HR 65 | Temp 97.8°F | Resp 16 | Ht 63.5 in | Wt 124.7 lb

## 2021-05-18 DIAGNOSIS — I8390 Asymptomatic varicose veins of unspecified lower extremity: Secondary | ICD-10-CM

## 2021-05-18 DIAGNOSIS — I872 Venous insufficiency (chronic) (peripheral): Secondary | ICD-10-CM | POA: Diagnosis not present

## 2021-05-18 NOTE — Progress Notes (Signed)
Office Note     CC:  follow up Requesting Provider:  Hillery Aldo, NP  HPI: Jessica [name] is a 66 y.o. (January 10, 1956) female who presents for evaluation of varicose and spider veins of bilateral lower extremities.  She states she has had these for years however over the past several months they have become more symptomatic with discomfort.  She wears copper socks however does not have any formal compression stockings.  She denies any history of DVT, venous ulcerations, trauma, or prior vascular interventions.  She is vision impaired with glaucoma and optic neuritis and she is here today with her husband.   Past Medical History:  Diagnosis Date   Glaucoma    Hypoglycemia    Optic neuropathy     Past Surgical History:  Procedure Laterality Date   ABDOMINAL HYSTERECTOMY     CESAREAN SECTION     EYE SURGERY     EYE SURGERY  10/11/2019   SMALL INTESTINE SURGERY  2002   TONSILLECTOMY     TUBAL LIGATION      Social History   Socioeconomic History   Marital status: Married    Spouse name: Marilu Favre   Number of children: 2   Years of education: Not on file   Highest education level: Bachelor's degree (e.g., BA, AB, BS)  Occupational History   Occupation: disabled  Tobacco Use   Smoking status: Former    Types: Cigarettes    Quit date: 1985    Years since quitting: 38.0   Smokeless tobacco: Never  Vaping Use   Vaping Use: Never used  Substance and Sexual Activity   Alcohol use: No   Drug use: No   Sexual activity: Not Currently  Other Topics Concern   Not on file  Social History Narrative   Patient is right-handed. She lives with her husband in a 1 story house. She does not exercise and only occasionally drinks caffeine.   Social Determinants of Health   Financial Resource Strain: Not on file  Food Insecurity: Not on file  Transportation Needs: Not on file  Physical Activity: Not on file  Stress: Not on file  Social Connections: Not on file  Intimate Partner  Violence: Not on file    Family History  Problem Relation Age of Onset   Ovarian cancer Mother    Heart disease Father    Diabetes Sister    Hypertension Sister     Current Outpatient Medications  Medication Sig Dispense Refill   albuterol (VENTOLIN HFA) 108 (90 Base) MCG/ACT inhaler INHALE 2 PUFFS INTO THE LUNGS EVERY 4 HOURS AS NEEDED     amoxicillin-clavulanate (AUGMENTIN) 875-125 MG tablet Take 1 tablet twice a day for 10 days for infection 20 tablet 0   b complex vitamins capsule Take 1 capsule by mouth daily.     Calcium Carb-Cholecalciferol 250-125 MG-UNIT TABS Take by mouth.     cetirizine (ZYRTEC) 10 MG tablet Take 10 mg by mouth daily.     Cholecalciferol (VITAMIN D3) 5000 units TABS Take 5,000 Units by mouth daily.     FLONASE 50 MCG/ACT nasal spray Place 2 sprays into both nostrils daily. 16 g 5   Magnesium Gluconate 500 (27 Mg) MG TABS Take 500 mg by mouth daily.     montelukast (SINGULAIR) 10 MG tablet TAKE 1 TABLET(10 MG) BY MOUTH AT BEDTIME 30 tablet 0   Multiple Vitamins-Minerals (MULTIVITAMIN WOMEN PO) Take 1 tablet by mouth daily.     naproxen sodium (ALEVE) 220 MG tablet  Take 220 mg by mouth daily as needed.     Omega-3 Fatty Acids (FISH OIL) 1200 MG CAPS Take 1,200 mg by mouth daily.     Polyethyl Glycol-Propyl Glycol (SYSTANE FREE OP) Apply to eye.     predniSONE (DELTASONE) 10 MG tablet Take one tablet by mouth twice a day for 4 days, then 1 tablet on day 5, then stop 9 tablet 0   No current facility-administered medications for this visit.    Allergies  Allergen Reactions   Motrin [Ibuprofen] Swelling    Tongue swells   Sulfa Antibiotics Swelling    Tongue swells   Amoxicillin-Pot Clavulanate     Other reaction(s): Cramps (ALLERGY/intolerance) Abd cramping and diarrhea   Beta Adrenergic Blockers Other (See Comments) and Nausea And Vomiting    Eye infection (only with eye drops), shortness of breath Certain eye gtts that have beta blocking   Contrast  Media [Iodinated Contrast Media] Other (See Comments)    Only use non-ionic solutions per md   Dorzolamide Hcl-Timolol Mal Other (See Comments)    Burning sensation   Gluten Meal Other (See Comments)    Rectal itching, intestinal inflammation   Iodine     Other reaction(s): Other (See Comments) Pt unsure but cannot tolerate non-ionic solutions   Macrobid [Nitrofurantoin Monohyd Macro] Itching   Oxycodone-Acetaminophen Other (See Comments)    hallucinations   Oxycodone-Aspirin Other (See Comments)    hallucinations   Sudafed [Pseudoephedrine Hcl]     ?swollen tongue   Ultram [Tramadol] Other (See Comments)    hallucinations   Wheat Bran Other (See Comments)    Rectal itching and burning inside of body (particularly on right side)   Sugar-Protein-Starch Rash and Other (See Comments)    Foggy thoughts, blood sugar fluctuates excessively   Yeast-Related Products Rash     REVIEW OF SYSTEMS:   [X]  denotes positive finding, [ ]  denotes negative finding Cardiac  Comments:  Chest pain or chest pressure:    Shortness of breath upon exertion:    Short of breath when lying flat:    Irregular heart rhythm:        Vascular    Pain in calf, thigh, or hip brought on by ambulation:    Pain in feet at night that wakes you up from your sleep:     Blood clot in your veins:    Leg swelling:         Pulmonary    Oxygen at home:    Productive cough:     Wheezing:         Neurologic    Sudden weakness in arms or legs:     Sudden numbness in arms or legs:     Sudden onset of difficulty speaking or slurred speech:    Temporary loss of vision in one eye:     Problems with dizziness:         Gastrointestinal    Blood in stool:     Vomited blood:         Genitourinary    Burning when urinating:     Blood in urine:        Psychiatric    Major depression:         Hematologic    Bleeding problems:    Problems with blood clotting too easily:        Skin    Rashes or ulcers:         Constitutional    Fever or  chills:      PHYSICAL EXAMINATION:  Vitals:   05/18/21 1109  BP: 100/63  Pulse: 65  Resp: 16  Temp: 97.8 F (36.6 C)  TempSrc: Temporal  SpO2: 97%  Weight: 124 lb 11.2 oz (56.6 kg)  Height: 5' 3.5" (1.613 m)    General:  WDWN in NAD; vital signs documented above Gait: Not observed HENT: WNL, normocephalic Pulmonary: normal non-labored breathing , without Rales, rhonchi,  wheezing Cardiac: regular HR, Abdomen: soft, NT, no masses Skin: without rashes Vascular Exam/Pulses:  Right Left  Radial 2+ (normal) 2+ (normal)  DP 2+ (normal) 2+ (normal)   Extremities: without ischemic changes, without Gangrene , without cellulitis; without open wounds Musculoskeletal: no muscle wasting or atrophy  Neurologic: A&O X 3;  No focal weakness or paresthesias are detected Psychiatric:  The pt has Normal affect.   Non-Invasive Vascular Imaging:   RLE venous reflux study shows an incompetent greater saphenous vein at the knee  Left lower extremity venous reflux study shows common femoral vein reflux and saphenofemoral junction reflux    ASSESSMENT/PLAN:: 66 y.o. female here for follow up for symptomatic bilateral lower extremity varicose veins and spider veins  -Bilateral lower extremity reflux study only showed mild findings of deep venous reflux in the left lower extremity and superficial venous reflux bilaterally.  Study was negative for DVT -Recommendations included 15 to 20 mmHg compression socks to be worn daily which were prescribed today, elevating the legs above the level of the heart, and avoiding prolonged sitting and standing. -We also discussed sclerotherapy however the patient would like to try the above conservative measures prior to considering sclerotherapy. -Patient can follow-up on an as-needed basis.  She will call with any questions or concerns or if she changes her mind about sclerotherapy   Emilie RutterMatthew Tarrance Januszewski, PA-C [facility] [phone]  Clinic MD:   Edilia Boickson

## 2021-07-10 ENCOUNTER — Other Ambulatory Visit: Payer: Self-pay | Admitting: Student

## 2021-07-10 DIAGNOSIS — Z780 Asymptomatic menopausal state: Secondary | ICD-10-CM

## 2021-12-07 ENCOUNTER — Telehealth: Payer: Self-pay | Admitting: Internal Medicine

## 2021-12-07 ENCOUNTER — Other Ambulatory Visit: Payer: Self-pay

## 2021-12-07 DIAGNOSIS — H409 Unspecified glaucoma: Secondary | ICD-10-CM

## 2021-12-07 MED ORDER — ONETOUCH VERIO W/DEVICE KIT: PACK | 0 refills | Status: DC

## 2021-12-07 MED ORDER — ONETOUCH VERIO VI STRP: ORAL_STRIP | 12 refills | Status: DC

## 2021-12-07 NOTE — Telephone Encounter (Signed)
RX sent to preferred pharmacy 

## 2021-12-07 NOTE — Telephone Encounter (Signed)
MEDICATION: Blood Sugar Meter, test strips, lancets  PHARMACY:  [facility] on [address]  HAS THE PATIENT CONTACTED THEIR PHARMACY?  Yes, insurance will cover  IS THIS A 90 DAY SUPPLY :   IS PATIENT OUT OF MEDICATION: never had  IF NOT; HOW MUCH IS LEFT:   LAST APPOINTMENT DATE: @04 /06/21  NEXT APPOINTMENT DATE:@12 /15/2023  DO WE HAVE YOUR PERMISSION TO LEAVE A DETAILED MESSAGE?: yes - [phone]  OTHER COMMENTS:

## 2022-01-11 ENCOUNTER — Emergency Department (HOSPITAL_BASED_OUTPATIENT_CLINIC_OR_DEPARTMENT_OTHER)
Admission: EM | Admit: 2022-01-11 | Discharge: 2022-01-11 | Disposition: A | Discharge status: Home / Self Care | Payer: Medicare Other | Attending: Emergency Medicine | Admitting: Emergency Medicine

## 2022-01-11 ENCOUNTER — Emergency Department (HOSPITAL_BASED_OUTPATIENT_CLINIC_OR_DEPARTMENT_OTHER): Payer: Medicare Other | Admitting: Radiology

## 2022-01-11 ENCOUNTER — Encounter (HOSPITAL_BASED_OUTPATIENT_CLINIC_OR_DEPARTMENT_OTHER): Payer: Self-pay

## 2022-01-11 ENCOUNTER — Other Ambulatory Visit: Payer: Self-pay

## 2022-01-11 DIAGNOSIS — H409 Unspecified glaucoma: Secondary | ICD-10-CM | POA: Diagnosis not present

## 2022-01-11 DIAGNOSIS — R9431 Abnormal electrocardiogram [ECG] [EKG]: Secondary | ICD-10-CM | POA: Insufficient documentation

## 2022-01-11 DIAGNOSIS — R002 Palpitations: Secondary | ICD-10-CM | POA: Insufficient documentation

## 2022-01-11 DIAGNOSIS — R531 Weakness: Secondary | ICD-10-CM | POA: Diagnosis not present

## 2022-01-11 DIAGNOSIS — Z20822 Contact with and (suspected) exposure to covid-19: Secondary | ICD-10-CM | POA: Diagnosis not present

## 2022-01-11 LAB — CBC
HCT: 41.2 % (ref 36.0–46.0)
Hemoglobin: 14.2 g/dL (ref 12.0–15.0)
MCH: 30.4 pg (ref 26.0–34.0)
MCHC: 34.5 g/dL (ref 30.0–36.0)
MCV: 88.2 fL (ref 80.0–100.0)
Platelets: 227 10*3/uL (ref 150–400)
RBC: 4.67 MIL/uL (ref 3.87–5.11)
RDW: 14.3 % (ref 11.5–15.5)
WBC: 6.9 10*3/uL (ref 4.0–10.5)
nRBC: 0 % (ref 0.0–0.2)

## 2022-01-11 LAB — BASIC METABOLIC PANEL
Anion gap: 14 (ref 5–15)
BUN: 22 mg/dL (ref 8–23)
CO2: 23 mmol/L (ref 22–32)
Calcium: 10.4 mg/dL — ABNORMAL HIGH (ref 8.9–10.3)
Chloride: 99 mmol/L (ref 98–111)
Creatinine, Ser: 0.69 mg/dL (ref 0.44–1.00)
GFR, Estimated: >60 mL/min (ref >60)
Glucose, Bld: 60 mg/dL — ABNORMAL LOW (ref 70–99)
Potassium: 4 mmol/L (ref 3.5–5.1)
Sodium: 136 mmol/L (ref 135–145)

## 2022-01-11 LAB — D-DIMER, QUANTITATIVE: D-Dimer, Quant: 0.44 ug/mL-FEU (ref 0.00–0.50)

## 2022-01-11 LAB — TROPONIN I (HIGH SENSITIVITY)
Troponin I (High Sensitivity): 2 ng/L (ref <18)
Troponin I (High Sensitivity): 3 ng/L (ref <18)

## 2022-01-11 LAB — SARS CORONAVIRUS 2 BY RT PCR: SARS Coronavirus 2 by RT PCR: NEGATIVE

## 2022-01-11 LAB — CBG MONITORING, ED: Glucose-Capillary: 67 mg/dL — ABNORMAL LOW (ref 70–99)

## 2022-01-11 NOTE — ED Notes (Signed)
Rechecked blood sugar on patient, was 67, gave an ensure and protein bar. Due to gluten allergy

## 2022-01-11 NOTE — ED Provider Notes (Signed)
[place] EMERGENCY DEPT Provider Note   CSN: [id number] Arrival date & time: 01/11/22  1746     History  Chief Complaint  Patient presents with   Palpitations    Jessica [name] is a 66 y.o. female.  HPI     Lay on left side, palpitations, rhythm off for longer period of time Feels like it flutters throughout the day Went to the dr Monday, wearing heart monitor now from PCP Muscles today, going to grocery store but woke up very weak, dry mouth Go into store and started dto feel like muscles twiching and right arm twitching, feeling lightheaded  Felt like fluttering in chest   Atropine for glaucoma drops, had held off over last week but still having symptoms   Felt dyspnea a few times this week, not sure if anxiety or dypsnea No chest pain, no nausea, vomiting or fevers but was heavy in chest a few times  Fluttering off and on   No hx of dvt/pe, had veins checked   D[facility] to [facility], stopped in [place] 8/12  Past Medical History:  Diagnosis Date   Glaucoma    Hypoglycemia    Optic neuropathy     Home Medications Prior to Admission medications   Medication Sig Start Date End Date Taking? Authorizing Provider  albuterol (VENTOLIN HFA) 108 (90 Base) MCG/ACT inhaler INHALE 2 PUFFS INTO THE LUNGS EVERY 4 HOURS AS NEEDED 06/12/19   [provider]  amoxicillin-clavulanate (AUGMENTIN) 875-125 MG tablet Take 1 tablet twice a day for 10 days for infection 09/02/20   Althea Charon, FNP  b complex vitamins capsule Take 1 capsule by mouth daily.    [provider]  Blood Glucose Monitoring Suppl (ONETOUCH VERIO) w/Device KIT Use as instructed to check blood sugars 12/07/21   Shamleffer, Melanie Crazier, MD  Calcium Carb-Cholecalciferol 250-125 MG-UNIT TABS Take by mouth.    [provider]  cetirizine (ZYRTEC) 10 MG tablet Take 10 mg by mouth daily.    [provider]  Cholecalciferol (VITAMIN D3) 5000 units TABS Take 5,000 Units by  mouth daily.    [provider]  FLONASE 50 MCG/ACT nasal spray Place 2 sprays into both nostrils daily. 01/20/20   Kennith Gain, MD  glucose blood [facility] VERIO) test strip Use as instructed 12/07/21   Shamleffer, Melanie Crazier, MD  Magnesium Gluconate 500 (27 Mg) MG TABS Take 500 mg by mouth daily.    [provider]  montelukast (SINGULAIR) 10 MG tablet TAKE 1 TABLET(10 MG) BY MOUTH AT BEDTIME 08/17/20   Kennith Gain, MD  Multiple Vitamins-Minerals (MULTIVITAMIN WOMEN PO) Take 1 tablet by mouth daily.    [provider]  naproxen sodium (ALEVE) 220 MG tablet Take 220 mg by mouth daily as needed.    [provider]  Omega-3 Fatty Acids (FISH OIL) 1200 MG CAPS Take 1,200 mg by mouth daily.    [provider]  Polyethyl Glycol-Propyl Glycol (SYSTANE FREE OP) Apply to eye.    [provider]  predniSONE (DELTASONE) 10 MG tablet Take one tablet by mouth twice a day for 4 days, then 1 tablet on day 5, then stop 09/02/20   Althea Charon, FNP      Allergies    Motrin [ibuprofen], Sulfa antibiotics, Amoxicillin-pot clavulanate, Beta adrenergic blockers, Contrast media [iodinated contrast media], Dorzolamide hcl-timolol mal, Gluten meal, Iodine, Macrobid [nitrofurantoin monohyd macro], Oxycodone-acetaminophen, Oxycodone-aspirin, Sudafed [pseudoephedrine hcl], Ultram [tramadol], Wheat bran, Sugar-protein-starch, and Yeast-related products    Review  of Systems   Review of Systems  Physical Exam Updated Vital Signs BP 100/65   Pulse 79   Temp 98.2 F (36.8 C)   Resp 14   Ht 5' 3.5" (1.613 m)   Wt 56.6 kg   SpO2 97%   BMI 21.76 kg/m  Physical Exam Vitals and nursing note reviewed.  Constitutional:      General: She is not in acute distress.    Appearance: She is well-developed. She is not diaphoretic.  HENT:     Head: Normocephalic and atraumatic.  Eyes:     Conjunctiva/sclera: Conjunctivae normal.   Cardiovascular:     Rate and Rhythm: Normal rate and regular rhythm.     Heart sounds: Normal heart sounds. No murmur heard.    No friction rub. No gallop.  Pulmonary:     Effort: Pulmonary effort is normal. No respiratory distress.     Breath sounds: Normal breath sounds. No wheezing or rales.  Abdominal:     General: There is no distension.     Palpations: Abdomen is soft.     Tenderness: There is no abdominal tenderness. There is no guarding.  Musculoskeletal:        General: No tenderness.     Cervical back: Normal range of motion.  Skin:    General: Skin is warm and dry.     Findings: No erythema or rash.  Neurological:     Mental Status: She is alert and oriented to person, place, and time.     ED Results / Procedures / Treatments   Labs (all labs ordered are listed, but only abnormal results are displayed) Labs Reviewed  BASIC METABOLIC PANEL - Abnormal; Notable for the following components:      Result Value   Glucose, Bld 60 (*)    Calcium 10.4 (*)    All other components within normal limits  CBG MONITORING, ED - Abnormal; Notable for the following components:   Glucose-Capillary 67 (*)    All other components within normal limits  SARS CORONAVIRUS 2 BY RT PCR  CBC  D-DIMER, QUANTITATIVE  TROPONIN I (HIGH SENSITIVITY)  TROPONIN I (HIGH SENSITIVITY)    EKG EKG Interpretation  Date/Time:  Thursday January 11 2022 18:06:51 EDT Ventricular Rate:  72 PR Interval:  160 QRS Duration: 74 QT Interval:  378 QTC Calculation: 413 R Axis:   71 Text Interpretation: Normal sinus rhythm Cannot rule out Anterior infarct , age undetermined Abnormal ECG When compared with ECG of 21-Aug-2016 23:27, PREVIOUS ECG IS PRESENT Confirmed by Thayer Jew [phone]) on 01/12/2022 10:47:21 AM  Radiology DG Chest 2 View  Result Date: 01/11/2022 CLINICAL DATA:  Palpitations, lightheadedness, shortness of breath EXAM: CHEST - 2 VIEW COMPARISON:  09/02/2020 FINDINGS: The heart size  and mediastinal contours are within normal limits. Both lungs are clear. The visualized skeletal structures are unremarkable. Monitor device overlies the left upper chest. IMPRESSION: No active cardiopulmonary disease. Electronically Signed   By: Jerilynn Mages.  Shick M.D.   On: 01/11/2022 18:34    Procedures Procedures    Medications Ordered in ED Medications - No data to display  ED Course/ Medical Decision Making/ A&P                           Medical Decision Making Amount and/or Complexity of Data Reviewed Labs: ordered. Radiology: ordered.   66yo female with hx of glaucoma presents with concern for palpitations.  Differential diagnosis for palpitations, pleuritic  pain includes pulmonary embolus, electrolyte abnormalities, anemia, pneumothorax, pneumonia, ACS, myocarditis, pericarditis.  EKG was done and evaluate by me and showed no acute ST changes and no signs of pericarditis. Chest x-ray was done and evaluated by me and radiology and showed no sign of pneumonia or pneumothorax. Low risk Wells with negative ddimer and have low suspicion for PE.  Patient is low risk HEART score and had delta troponins which were both negative.  Labs personally evaluated and interpreted by me without significant anemia, electrolyte abnormaliteis. Mild hypoglycemia, given food prior to dc.  Mild hypercalemia, recommend recheck as outpatient.  Has ziopatch in place. Recommend continued PCP follow up.           Final Clinical Impression(s) / ED Diagnoses Final diagnoses:  Palpitations    Rx / DC Orders ED Discharge Orders     None         Gareth Morgan, MD 01/12/22 2304

## 2022-01-11 NOTE — ED Notes (Signed)
Pt care taken, resting, was advised that we will be drawing another troponin at 8pm. No complaints at this time.

## 2022-01-11 NOTE — ED Triage Notes (Signed)
Patient here POV from Home.  Endorses Palpitations, Lightheadedness, and "Muscle Weakness" that has been Intermittent recently but worse and more consistent since this AM.   No CP. Some SOB. Mild Nausea. No Emesis. No Diarrhea.   NAD Noted during Triage. A&Ox4. GCS 15. Ambulatory.

## 2022-03-07 ENCOUNTER — Telehealth: Payer: Self-pay

## 2022-03-07 NOTE — Telephone Encounter (Signed)
Call from pt inquiring about senior fitness programs. Has already left message for [facility] staff. Explained PREP to pt, not necessarily for seniors  Pt wants to attend. Explained would need a referral from her PCP. She will ask MD office to call me for process.

## 2022-03-16 ENCOUNTER — Telehealth: Payer: Self-pay | Admitting: Internal Medicine

## 2022-03-16 NOTE — Telephone Encounter (Signed)
Patient called to check for date and time of next appointment.  Patient also states that she has not been checking her blood sugar levels at all because with her poor eye sight she cannot read the "machine" numbers.  (Blood Glucose Monitoring Suppl (ONETOUCH VERIO) w/Device KIT )  Patient would like to know what she can do about not being able to read the numbers as she states that she has no one to help her either.

## 2022-03-19 NOTE — Telephone Encounter (Signed)
Patient advised and has been placed on a waiting list for sooner appt

## 2022-04-03 ENCOUNTER — Ambulatory Visit (INDEPENDENT_AMBULATORY_CARE_PROVIDER_SITE_OTHER): Payer: Medicare Other | Admitting: Allergy and Immunology

## 2022-04-03 VITALS — BP 116/70 | HR 73 | Temp 97.8°F | Resp 20 | Ht 63.0 in | Wt 116.2 lb

## 2022-04-03 DIAGNOSIS — T17308A Unspecified foreign body in larynx causing other injury, initial encounter: Secondary | ICD-10-CM

## 2022-04-03 DIAGNOSIS — K219 Gastro-esophageal reflux disease without esophagitis: Secondary | ICD-10-CM

## 2022-04-03 DIAGNOSIS — K224 Dyskinesia of esophagus: Secondary | ICD-10-CM

## 2022-04-03 MED ORDER — PANTOPRAZOLE SODIUM 40 MG PO TBEC: 40.0000 mg | DELAYED_RELEASE_TABLET | ORAL | 5 refills | Status: DC

## 2022-04-03 MED ORDER — FAMOTIDINE 40 MG PO TABS: 40.0000 mg | ORAL_TABLET | Freq: Every evening | ORAL | 5 refills | Status: DC

## 2022-04-03 NOTE — Progress Notes (Signed)
[facility] [place] [facility] [place] [facility] [place]   Follow-up Note  Referring Provider: Cipriano Mile, NP Primary Provider: Cipriano Mile, NP Date of Office Visit: 04/03/2022  Subjective:   Jessica [name] (DOB: 12/31/1955) is a 65 y.o. female who returns to the [place] on 04/03/2022 in re-evaluation of the following:  HPI: Jessica [name] returns to this clinic with a history of nonallergic rhinitis and adverse food reaction last seen by Dr. Nelva Bush on 15 April 2020.  I have never seen her in this clinic.  Today she states that she is having worse problems with her food allergies.  She has been evaluated in the past by Dr. Nelva Bush and found not to be allergic to specific food products.  She complains of eating and then developing throat clearing and mucus in her throat that is really thick and feels as though there is a lump in her throat.  Sometimes she has troubles with choking especially when she drinks vitamins.  She states that this happens more commonly when she eats carbohydrate foods.  She does not really drink any caffeine and has a rare chocolate.  Apparently she has obstruction to swallowing her food gets stuck and she has to drink a bunch of water to have it passed.  She states that this happens with a frequency of about twice a month.  Apparently she had an upper endoscopy performed at [facility] and required an esophageal dilation with that procedure.  She does not know the results of her esophageal biopsy and she has never heard the term eosinophilic esophagitis.  Allergies as of 04/03/2022       Reactions   Motrin [ibuprofen] Swelling   Tongue swells   Sulfa Antibiotics Swelling   Tongue swells   Amoxicillin-pot Clavulanate    Other reaction(s): Cramps (ALLERGY/intolerance) Abd cramping and diarrhea   Beta Adrenergic Blockers Other (See Comments), Nausea And Vomiting   Eye infection (only with eye drops), shortness of breath Certain eye gtts that have  beta blocking   Contrast Media [iodinated Contrast Media] Other (See Comments)   Only use non-ionic solutions per md   Dorzolamide Hcl-timolol Mal Other (See Comments)   Burning sensation   Gluten Meal Other (See Comments)   Rectal itching, intestinal inflammation   Iodine    Other reaction(s): Other (See Comments) Pt unsure but cannot tolerate non-ionic solutions   Macrobid [nitrofurantoin Monohyd Macro] Itching   Oxycodone-acetaminophen Other (See Comments)   hallucinations   Oxycodone-aspirin Other (See Comments)   hallucinations   Sudafed [pseudoephedrine Hcl]    ?swollen tongue   Ultram [tramadol] Other (See Comments)   hallucinations   Wheat Bran Other (See Comments)   Rectal itching and burning inside of body (particularly on right side)   Sugar-protein-starch Rash, Other (See Comments)   Foggy thoughts, blood sugar fluctuates excessively   Yeast-related Products Rash        Medication List    albuterol 108 (90 Base) MCG/ACT inhaler Commonly known as: VENTOLIN HFA INHALE 2 PUFFS INTO THE LUNGS EVERY 4 HOURS AS NEEDED   b complex vitamins capsule Take 1 capsule by mouth daily.   Calcium Carb-Cholecalciferol 250-125 MG-UNIT Tabs Take by mouth.   cetirizine 10 MG tablet Commonly known as: ZYRTEC Take 10 mg by mouth daily.   cromolyn 100 MG/5ML solution Commonly known as: GASTROCROM Take by mouth.   cyclopentolate 1 % ophthalmic solution Commonly known as: CYCLODRYL,CYCLOGYL SMARTSIG:In Eye(s)   Flonase 50 MCG/ACT nasal spray  Generic drug: fluticasone Place 2 sprays into both nostrils daily.   Magnesium Gluconate 500 (27 Mg) MG Tabs Take 500 mg by mouth daily.   montelukast 10 MG tablet Commonly known as: SINGULAIR TAKE 1 TABLET(10 MG) BY MOUTH AT BEDTIME   SYSTANE FREE OP Apply to eye.   Vitamin D3 125 MCG (5000 UT) Tabs Take 5,000 Units by mouth daily.        Past Medical History:  Diagnosis Date  . Glaucoma   . Hypoglycemia   . Optic  neuropathy     Past Surgical History:  Procedure Laterality Date  . ABDOMINAL HYSTERECTOMY    . CESAREAN SECTION    . EYE SURGERY    . EYE SURGERY  10/11/2019  . SMALL INTESTINE SURGERY  2002  . TONSILLECTOMY    . TUBAL LIGATION      Review of systems negative except as noted in HPI / PMHx or noted below:  Review of Systems  Constitutional: Negative.   HENT: Negative.    Eyes: Negative.   Respiratory: Negative.    Cardiovascular: Negative.   Gastrointestinal: Negative.   Genitourinary: Negative.   Musculoskeletal: Negative.   Skin: Negative.   Neurological: Negative.   Endo/Heme/Allergies: Negative.   Psychiatric/Behavioral: Negative.       Objective:   Vitals:   04/03/22 1349  BP: 116/70  Pulse: 73  Resp: 20  Temp: 97.8 F (36.6 C)  SpO2: 99%   Height: 5\' 3"  (160 cm)  Weight: 116 lb 3.2 oz (52.7 kg)   Physical Exam HENT:     Head: Normocephalic.     Nose: No mucosal edema.     Mouth/Throat:     Pharynx: No pharyngeal swelling, oropharyngeal exudate or posterior oropharyngeal erythema.  Lymphadenopathy:     Cervical: No cervical adenopathy.    Diagnostics: none  Assessment and Plan:   1. LPRD (laryngopharyngeal reflux disease)   2. Esophageal dysmotility   3. Choking, initial encounter     1.  Review biopsy results from recent upper endoscopy January 2023 at [facility]  2. Treat reflux / LPR:   A.  Pantoprazole 40 mg -1 tablet in AM  B.  Famotidine 40 mg -1 tablet in p.m.  3.  Obtain modified barium swallow for choking/esophageal dysmotility  4.  Return to clinic in 4 weeks or earlier if problem  It sounds as though Jessica [name] has an issue with LPR.  It is the active eating and not so much what she eats that gives rise to this issue.  In addition, she has a history very consistent with esophageal dysmotility and apparently had an upper endoscopy with dilation performed at [facility] in January 2023.  I need to see the biopsy of her esophagus to make  sure were not dealing with eosinophilic esophagitis.  Because she has had choking we are going to obtain a modified barium swallow.  I will see her back in this clinic in 4 weeks.  February 2023, MD Allergy / Immunology [facility]

## 2022-04-03 NOTE — Patient Instructions (Addendum)
  1.  Review biopsy results from recent upper endoscopy January 2023 at [facility]  2. Treat reflux / LPR:   A.  Pantoprazole 40 mg -1 tablet in AM  B.  Famotidine 40 mg -1 tablet in p.m.  3.  Obtain modified barium swallow for choking/esophageal dysmotility  4.  Return to clinic in 4 weeks or earlier if problem

## 2022-04-04 ENCOUNTER — Encounter: Payer: Self-pay | Admitting: Allergy and Immunology

## 2022-04-04 NOTE — Addendum Note (Signed)
Addended by: Dub Mikes on: 04/04/2022 06:46 PM   Modules accepted: Orders

## 2022-04-16 ENCOUNTER — Telehealth: Payer: Self-pay

## 2022-04-16 NOTE — Telephone Encounter (Signed)
[facility] pt this am reference starting PREP.  Confirmed we do have referral from Dr Jacinto Reap (secure message) for PREP.  Called pt back. Can do either [employer] or [employer]. Would like an am class around 10ish in am on T/TH. Told her we would call her back in Jan to start.  Pt has my number for call back.

## 2022-04-18 ENCOUNTER — Telehealth: Payer: Self-pay

## 2022-04-18 NOTE — Telephone Encounter (Signed)
Called to discuss December PREP class schedule at [facility]; wants to attend 12/12 class, every T/Th 10-11:15; assessment visit scheduled for 12/11 at 10am.

## 2022-04-23 NOTE — Progress Notes (Signed)
Arrived today for assessment visit for PREP class that starts 12/12; decided she would like to wait until next class after holidays to begin PREP on 05/15/22, every T/Th 12-1:15; assessment visit rescheduled for 12/28 at 2 pm

## 2022-04-26 ENCOUNTER — Telehealth: Payer: Self-pay | Admitting: Allergy and Immunology

## 2022-04-26 ENCOUNTER — Other Ambulatory Visit: Payer: Self-pay

## 2022-04-26 NOTE — Telephone Encounter (Signed)
Pt states she can't remember the details of the test she needs She would like a call back so that she can get that done. It is okay to leave detailed message.

## 2022-04-26 NOTE — Telephone Encounter (Signed)
LVM letting patient know that she needs a modified barium swallow for choking/esophageal dysmotility per Dr. Lucie Leather. Patient stated that leaving a detailed note was ok.

## 2022-04-27 ENCOUNTER — Ambulatory Visit (INDEPENDENT_AMBULATORY_CARE_PROVIDER_SITE_OTHER): Payer: Medicare Other | Admitting: Internal Medicine

## 2022-04-27 ENCOUNTER — Encounter: Payer: Self-pay | Admitting: Internal Medicine

## 2022-04-27 VITALS — BP 112/72 | HR 60 | Ht 63.0 in | Wt 116.0 lb

## 2022-04-27 DIAGNOSIS — E162 Hypoglycemia, unspecified: Secondary | ICD-10-CM | POA: Diagnosis not present

## 2022-04-27 LAB — POCT GLYCOSYLATED HEMOGLOBIN (HGB A1C): Hemoglobin A1C: 5 % (ref 4.0–5.6)

## 2022-04-27 LAB — POCT GLUCOSE (DEVICE FOR HOME USE): POC Glucose: 56 mg/dl — AB (ref 70–99)

## 2022-04-27 MED ORDER — PRODIGY AUTOCODE BLOOD GLUCOSE W/DEVICE KIT: 1.0000 | PACK | Freq: Every day | 0 refills | Status: DC

## 2022-04-27 MED ORDER — PRODIGY AUTOCODE BLOOD GLUCOSE DEVI: 1.0000 | Freq: Every day | 6 refills | Status: DC

## 2022-04-27 NOTE — Progress Notes (Addendum)
Name: Jessica [name]  MRN/ DOB: [id number], 04/21/1956    Age/ Sex: 66 y.o., female    PCP: Cipriano Mile, NP   Reason for Endocrinology Evaluation: Low blood glucose      Date of Initial Endocrinology Evaluation: 08/18/2019    HPI: Jessica [name] is a 66 y.o. female with a past medical history of hyperlipidemia. The patient presented for initial endocrinology clinic visit on 08/18/2019 for consultative assistance with her low blood glucose.   Patient was seen by her PCP on 07/17/2019 and requested a referral to endocrinology for fluctuating blood sugars.  She did endorse fogginess of the brain and sweating when her BG's are low.  Patient also attributes visual changes to fluctuating BG's.  She has been screened for diabetes and thyroid disease through her PCP with  normal results.   Patient has glaucoma and been evaluated by ophthalmology.  She had small intestinal surgery in 2002    She has not been to our clinic since April 2021.  At that time she was advised to obtain a glucose meter and check glucose periodically and to notify us with BG's <60 mg/dL      SUBJECTIVE:    Today (04/27/22):  [name] is here for a follow up on questionable hypoglycemia    Serum glucose 60 mg on 01/11/2022  She has been doing a fast with last meal 2 days ago , she is on liquids only that includes shakes that contain fruits with the last drink last night   She is S/P EGD 05/2021 with dilatation of the distal esophagus. Normal duodenal bulb with normal 1st and 2nd portion of duodenum   She has not been to check glucose at home due to blindness   She denies sensation of feeling shaky and jittery  She has multiple allergies and when she eats crabs she feels fogginess  in the brain , rash  and change in vision, she feels like she is drinks.   She was seeing a holistic but stopped due to high cost  She has a pending appointment with immunology          HISTORY:  Past Medical History:   Past Medical History:  Diagnosis Date   Glaucoma    Hypoglycemia    Optic neuropathy    Past Surgical History:  Past Surgical History:  Procedure Laterality Date   ABDOMINAL HYSTERECTOMY     CESAREAN SECTION     EYE SURGERY     EYE SURGERY  10/11/2019   SMALL INTESTINE SURGERY  2002   TONSILLECTOMY     TUBAL LIGATION      Social History:  reports that she quit smoking about 38 years ago. Her smoking use included cigarettes. She has never used smokeless tobacco. She reports that she does not drink alcohol and does not use drugs. Family History: family history includes Diabetes in her sister; Heart disease in her father; Hypertension in her sister; Ovarian cancer in her mother.   HOME MEDICATIONS: Allergies as of 04/27/2022       Reactions   Motrin [ibuprofen] Swelling   Tongue swells   Sulfa Antibiotics Swelling   Tongue swells   Amoxicillin-pot Clavulanate    Other reaction(s): Cramps (ALLERGY/intolerance) Abd cramping and diarrhea   Beta Adrenergic Blockers Other (See Comments), Nausea And Vomiting   Eye infection (only with eye drops), shortness of breath Certain eye gtts that have beta blocking   Contrast Media [iodinated Contrast Media] Other (See Comments)  Only use non-ionic solutions per md   Dorzolamide Hcl-timolol Mal Other (See Comments)   Burning sensation   Gluten Meal Other (See Comments)   Rectal itching, intestinal inflammation   Iodine    Other reaction(s): Other (See Comments) Pt unsure but cannot tolerate non-ionic solutions   Macrobid [nitrofurantoin Monohyd Macro] Itching   Oxycodone-acetaminophen Other (See Comments)   hallucinations   Oxycodone-aspirin Other (See Comments)   hallucinations   Sudafed [pseudoephedrine Hcl]    ?swollen tongue   Ultram [tramadol] Other (See Comments)   hallucinations   Wheat Bran Other (See Comments)   Rectal itching and burning inside of body (particularly on right side)   Sugar-protein-starch Rash, Other  (See Comments)   Foggy thoughts, blood sugar fluctuates excessively   Yeast-related Products Rash        Medication List        Accurate as of April 27, 2022  3:44 PM. If you have any questions, ask your nurse or doctor.          albuterol 108 (90 Base) MCG/ACT inhaler Commonly known as: VENTOLIN HFA INHALE 2 PUFFS INTO THE LUNGS EVERY 4 HOURS AS NEEDED   b complex vitamins capsule Take 1 capsule by mouth daily.   Calcium Carb-Cholecalciferol 250-125 MG-UNIT Tabs Take by mouth.   cetirizine 10 MG tablet Commonly known as: ZYRTEC Take 10 mg by mouth daily.   cromolyn 100 MG/5ML solution Commonly known as: GASTROCROM Take by mouth.   cyclopentolate 1 % ophthalmic solution Commonly known as: CYCLODRYL,CYCLOGYL SMARTSIG:In Eye(s)   famotidine 40 MG tablet Commonly known as: PEPCID Take 1 tablet (40 mg total) by mouth at bedtime.   Flonase 50 MCG/ACT nasal spray Generic drug: fluticasone Place 2 sprays into both nostrils daily.   Magnesium Gluconate 500 (27 Mg) MG Tabs Take 500 mg by mouth daily.   montelukast 10 MG tablet Commonly known as: SINGULAIR TAKE 1 TABLET(10 MG) BY MOUTH AT BEDTIME   pantoprazole 40 MG tablet Commonly known as: Protonix Take 1 tablet (40 mg total) by mouth every morning.   Prodigy Autocode Blood Glucose w/Device Kit 1 Device by Does not apply route daily in the afternoon. Started by: Dorita Sciara, MD   Prodigy Autocode Blood Glucose Devi 1 Device by Does not apply route daily in the afternoon. Started by: Dorita Sciara, MD   SYSTANE FREE OP Apply to eye.   Vitamin D3 125 MCG (5000 UT) Tabs Take 5,000 Units by mouth daily.           OBJECTIVE:  VS: BP 112/72 (BP Location: Left Arm, Patient Position: Sitting, Cuff Size: Small)   Pulse 60   Ht _0  (1.6 m)   Wt 116 lb (52.6 kg)   SpO2 99%   BMI 20.55 kg/m    Wt Readings from Last 3 Encounters:  04/27/22 116 lb (52.6 kg)  04/03/22 116 lb 3.2  oz (52.7 kg)  01/11/22 124 lb 12.5 oz (56.6 kg)     EXAM: General: Pt appears well and is in NAD  Neck: General: Supple without adenopathy. Thyroid: Thyroid size normal.  No goiter or nodules appreciated.   Lungs: Clear with good BS bilat   Heart: Auscultation: RRR.  Extremities:  BL LE: No pretibial edema.  Mental Status: Judgment, insight: Intact Mood and affect: No depression, anxiety, or agitation     DATA REVIEWED:  Latest Reference Range & Units 04/27/22 14:10  Sodium 134 - 144 mmol/L 145 (H)  Potassium 3.5 -  5.2 mmol/L 5.0  Chloride 96 - 106 mmol/L 102  CO2 20 - 29 mmol/L 17 (L)  Glucose 70 - 99 mg/dL 51 (L)  BUN 8 - 27 mg/dL 16  Creatinine 0.57 - 1.00 mg/dL 0.75  Calcium 8.7 - 10.3 mg/dL 9.7  BUN/Creatinine Ratio 12 - 28  21  eGFR >59 mL/min/1.73 88      Latest Reference Range & Units 04/27/22 14:10 04/27/22 14:15  Acetohexamide 20 - 60 ug/mL Negative   Chlorpropamide 75 - 250 ug/mL Negative   Glimepiride 80 - 250 ng/mL Negative   Glipizide 200 - 1000 ng/mL Negative   Glyburide UP TO 1500 ng/mL Negative   Nateglinide UP TO 10000 ng/mL Negative   Repaglinide UP TO 200 ng/mL Negative   Tolazamide UP TO 80 ug/mL Negative   Tolbutamide 40 - 100 ug/mL Negative   Beta-Hydroxybutyrate mg/dL 49 (H)   Glucose 70 - 99 mg/dL 51 (L)   POC Glucose 70 - 99 mg/dl  56 !  Hemoglobin A1C 4.0 - 5.6 %  5.0  C-Peptide 1.1 - 4.4 ng/mL 0.6 (L)   INSULIN 2.6 - 24.9 uIU/mL 2.9   Proinsulin 0.0 - 10.0 pmol/L 2.4     ASSESSMENT/PLAN/RECOMMENDATIONS:    Blurry Vision :    -Patient is concerned about hypoglycemia  - She has not had a meal in ~48 hr , she is on liquids only.  - Serum glucose 51 mg/dL with low C-peptide and low normal insulin - I am awaiting the results of the rest of her labs to include proinsulin, BHB, hypoglycemia panel  - At this time and based on low C-peptide ,there's NO evidence of hyperinsulinemia.  - Pt will be encouraged to eat proper meals as I  suspect the hx of small intestine  sx has to do with decreased absorption   Addendum: C-peptide is low with low normal insulin and proinsulin and elevated BHB. All these EXCLUDE insulinoma.     Signed electronically by: Mack Guise, MD  [facility] Endocrinology  [facility] [address] [place], [place] [address] Phone: [phone] FAX: [phone]   CC: Cipriano Mile, NP [place] [address] Phone: [phone] Fax: [phone]   Return to Endocrinology clinic as below: Future Appointments  Date Time Provider [place]  05/01/2022 10:00 AM Kozlow, Donnamarie Poag, MD [facility] None

## 2022-05-01 ENCOUNTER — Ambulatory Visit (INDEPENDENT_AMBULATORY_CARE_PROVIDER_SITE_OTHER): Payer: Medicare Other | Admitting: Allergy and Immunology

## 2022-05-01 ENCOUNTER — Other Ambulatory Visit: Payer: Self-pay

## 2022-05-01 VITALS — BP 102/68 | HR 63 | Temp 98.1°F | Resp 18 | Ht 63.0 in

## 2022-05-01 DIAGNOSIS — J310 Chronic rhinitis: Secondary | ICD-10-CM | POA: Diagnosis not present

## 2022-05-01 DIAGNOSIS — T17308A Unspecified foreign body in larynx causing other injury, initial encounter: Secondary | ICD-10-CM

## 2022-05-01 DIAGNOSIS — K224 Dyskinesia of esophagus: Secondary | ICD-10-CM | POA: Diagnosis not present

## 2022-05-01 DIAGNOSIS — T17308D Unspecified foreign body in larynx causing other injury, subsequent encounter: Secondary | ICD-10-CM

## 2022-05-01 DIAGNOSIS — K219 Gastro-esophageal reflux disease without esophagitis: Secondary | ICD-10-CM

## 2022-05-01 NOTE — Patient Instructions (Addendum)
  1. Treat reflux / LPR:   A.  Pantoprazole 40 mg -1 tablet in AM  B.  Famotidine 40 mg -1 tablet in p.m.  2.  Obtain modified barium swallow for choking/esophageal dysmotility (second request)  3. Review biopsy results from recent upper endoscopy January 2023 at [facility] (second request)  4. Visit with ENT to examine throat for LPR  5. Blood - ANA w/R, CBC w/d  4.  Return to clinic in 4 weeks or earlier if problem

## 2022-05-01 NOTE — Progress Notes (Signed)
[place] [facility] [place] [facility] [place] [facility] [place]   Follow-up Note  Referring Provider: Hillery Aldo, NP Primary Provider: Hillery Aldo, NP Date of Office Visit: 05/01/2022  Subjective:   Jessica [name] (DOB: [birth year]) is a 66 y.o. female who [facility] in re-evaluation of the following:  HPI: Jessica [name] returns to the clinic in evaluation of rhinitis, adverse food reaction, reflux, esophageal dysmotility.  I last saw her in this clinic 03 April 2022.  She used a proton pump inhibitor and H2 receptor blocker on a regular basis and she is really not that much better.  She still has lots of throat clearing and a lump in her throat.  She still has obstruction of swallowing.  She continues on cetirizine and Flonase for her upper airway issue which is working quite well.  Allergies as of 05/01/2022       Reactions   Motrin [ibuprofen] Swelling   Tongue swells   Sulfa Antibiotics Swelling   Tongue swells   Flagyl [metronidazole] Other (See Comments)   C-Diff   Amoxicillin-pot Clavulanate    Other reaction(s): Cramps (ALLERGY/intolerance) Abd cramping and diarrhea   Beta Adrenergic Blockers Other (See Comments), Nausea And Vomiting   Eye infection (only with eye drops), shortness of breath Certain eye gtts that have beta blocking   Contrast Media [iodinated Contrast Media] Other (See Comments)   Only use non-ionic solutions per md   Dorzolamide Hcl-timolol Mal Other (See Comments)   Burning sensation   Gluten Meal Other (See Comments)   Rectal itching, intestinal inflammation   Iodine    Other reaction(s): Other (See Comments) Pt unsure but cannot tolerate non-ionic solutions   Macrobid [nitrofurantoin Monohyd Macro] Itching   Oxycodone-acetaminophen Other (See Comments)   hallucinations   Oxycodone-aspirin Other (See Comments)   hallucinations   Sudafed [pseudoephedrine Hcl]    ?swollen tongue   Ultram [tramadol] Other  (See Comments)   hallucinations   Wheat Bran Other (See Comments)   Rectal itching and burning inside of body (particularly on right side)   Sugar-protein-starch Rash, Other (See Comments)   Foggy thoughts, blood sugar fluctuates excessively   Yeast-related Products Rash        Medication List    albuterol 108 (90 Base) MCG/ACT inhaler Commonly known as: VENTOLIN HFA INHALE 2 PUFFS INTO THE LUNGS EVERY 4 HOURS AS NEEDED   b complex vitamins capsule Take 1 capsule by mouth daily.   Calcium Carb-Cholecalciferol 250-125 MG-UNIT Tabs Take by mouth.   cetirizine 10 MG tablet Commonly known as: ZYRTEC Take 10 mg by mouth daily.   cromolyn 100 MG/5ML solution Commonly known as: GASTROCROM Take by mouth.   cyclopentolate 1 % ophthalmic solution Commonly known as: CYCLODRYL,CYCLOGYL SMARTSIG:In Eye(s)   famotidine 40 MG tablet Commonly known as: PEPCID Take 1 tablet (40 mg total) by mouth at bedtime.   Flonase 50 MCG/ACT nasal spray Generic drug: fluticasone Place 2 sprays into both nostrils daily.   Magnesium Gluconate 500 (27 Mg) MG Tabs Take 500 mg by mouth daily.   montelukast 10 MG tablet Commonly known as: SINGULAIR TAKE 1 TABLET(10 MG) BY MOUTH AT BEDTIME   NON FORMULARY Take 1 Scoop by mouth 2 (two) times daily.   pantoprazole 40 MG tablet Commonly known as: Protonix Take 1 tablet (40 mg total) by mouth every morning.   Prodigy Autocode Blood Glucose w/Device Kit 1 Device by Does not apply route daily in the afternoon.  Prodigy Autocode Blood Glucose Devi 1 Device by Does not apply route daily in the afternoon.   SYSTANE FREE OP Apply to eye.   Vitamin D3 125 MCG (5000 UT) Tabs Take 5,000 Units by mouth daily.    Past Medical History:  Diagnosis Date   Glaucoma    Hypoglycemia    Optic neuropathy     Past Surgical History:  Procedure Laterality Date   ABDOMINAL HYSTERECTOMY     CESAREAN SECTION     EYE SURGERY     EYE SURGERY   10/11/2019   SMALL INTESTINE SURGERY  2002   TONSILLECTOMY     TUBAL LIGATION      Review of systems negative except as noted in HPI / PMHx or noted below:  Review of Systems  Constitutional: Negative.   HENT: Negative.    Eyes: Negative.   Respiratory: Negative.    Cardiovascular: Negative.   Gastrointestinal: Negative.   Genitourinary: Negative.   Musculoskeletal: Negative.   Skin: Negative.   Neurological: Negative.   Endo/Heme/Allergies: Negative.   Psychiatric/Behavioral: Negative.       Objective:   Vitals:   05/01/22 1034  BP: 102/68  Pulse: 63  Resp: 18  Temp: 98.1 F (36.7 C)  SpO2: 100%   Height: 5\' 3"  (160 cm)      Physical Exam Constitutional:      Appearance: She is not diaphoretic.  HENT:     Head: Normocephalic.     Right Ear: Tympanic membrane, ear canal and external ear normal.     Left Ear: Tympanic membrane, ear canal and external ear normal.     Nose: Nose normal. No mucosal edema or rhinorrhea.     Mouth/Throat:     Pharynx: Uvula midline. No oropharyngeal exudate.  Eyes:     Conjunctiva/sclera: Conjunctivae normal.  Neck:     Thyroid: No thyromegaly.     Trachea: Trachea normal. No tracheal tenderness or tracheal deviation.  Cardiovascular:     Rate and Rhythm: Normal rate and regular rhythm.     Heart sounds: Normal heart sounds, S1 normal and S2 normal. No murmur heard. Pulmonary:     Effort: No respiratory distress.     Breath sounds: Normal breath sounds. No stridor. No wheezing or rales.  Lymphadenopathy:     Head:     Right side of head: No tonsillar adenopathy.     Left side of head: No tonsillar adenopathy.     Cervical: No cervical adenopathy.  Skin:    Findings: No erythema or rash.     Nails: There is no clubbing.  Neurological:     Mental Status: She is alert.     Diagnostics: Results of blood tests obtained 27 April 2022 identifies creatinine 0.75 mg/DL.   Assessment and Plan:   1. LPRD  (laryngopharyngeal reflux disease)   2. Esophageal dysmotility   3. Choking, subsequent encounter   4. Nonallergic rhinitis     1. Treat reflux / LPR:   A.  Pantoprazole 40 mg -1 tablet in AM  B.  Famotidine 40 mg -1 tablet in p.m.  2.  Obtain modified barium swallow for choking/esophageal dysmotility (second request)  3. Review biopsy results from recent upper endoscopy January 2023 at [facility] (second request)  4. Visit with ENT to examine throat for LPR  5. Blood - ANA w/R, CBC w/d  4.  Return to clinic in 4 weeks or earlier if problem  Maleiya needs further assessment of her throat  and esophageal issues with the plan noted above.  Once I can gather all that information together we will make a determination about how to proceed.  Laurette Schimke, MD Allergy / Immunology [facility]

## 2022-05-02 ENCOUNTER — Encounter: Payer: Self-pay | Admitting: Allergy and Immunology

## 2022-05-02 LAB — CBC WITH DIFFERENTIAL
Basophils Absolute: 0 10*3/uL (ref 0.0–0.2)
Basos: 1 %
EOS (ABSOLUTE): 0.1 10*3/uL (ref 0.0–0.4)
Eos: 3 %
Hematocrit: 38.1 % (ref 34.0–46.6)
Hemoglobin: 12.5 g/dL (ref 11.1–15.9)
Immature Grans (Abs): 0 10*3/uL (ref 0.0–0.1)
Immature Granulocytes: 0 %
Lymphocytes Absolute: 1.9 10*3/uL (ref 0.7–3.1)
Lymphs: 44 %
MCH: 29.7 pg (ref 26.6–33.0)
MCHC: 32.8 g/dL (ref 31.5–35.7)
MCV: 91 fL (ref 79–97)
Monocytes Absolute: 0.5 10*3/uL (ref 0.1–0.9)
Monocytes: 12 %
Neutrophils Absolute: 1.7 10*3/uL (ref 1.4–7.0)
Neutrophils: 40 %
RBC: 4.21 x10E6/uL (ref 3.77–5.28)
RDW: 12.9 % (ref 11.7–15.4)
WBC: 4.2 10*3/uL (ref 3.4–10.8)

## 2022-05-02 LAB — ANA W/REFLEX: Anti Nuclear Antibody (ANA): NEGATIVE

## 2022-05-04 ENCOUNTER — Other Ambulatory Visit: Payer: Self-pay

## 2022-05-04 DIAGNOSIS — K224 Dyskinesia of esophagus: Secondary | ICD-10-CM

## 2022-05-04 DIAGNOSIS — T17308D Unspecified foreign body in larynx causing other injury, subsequent encounter: Secondary | ICD-10-CM

## 2022-05-10 NOTE — Progress Notes (Signed)
[facility] PREP Evaluation  Patient Details  Name: Jessica [name] MRN: [id number] Date of Birth: November 13, 1955 Age: 66 y.o. PCP: Hillery Aldo, NP  Vitals:   05/10/22 1446  BP: 90/64  Pulse: 67  SpO2: 97%  Weight: 123 lb 9.6 oz (56.1 kg)     [facility] Eval - 05/10/22 1400       [facility] "PREP" Location   [facility] "PREP" Location [facility]      Referral    Referring Provider [place]    Reason for referral Other    Program Start Date 05/15/22      Measurement   Waist Circumference 30 inches    Hip Circumference 35 inches    Body fat 29.8 percent      Information for Trainer   Goals --   Establish strength training home routine; understand nutrition/food impact on body   Current Exercise --   home routine   Current Barriers --   vision impairment   Restrictions/Precautions Fall risk;Assistive device      Timed Up and Go (TUGS)   Timed Up and Go Moderate risk 10-12 seconds      Mobility and Daily Activities   I find it easy to walk up or down two or more flights of stairs. 2    I have no trouble taking out the trash. 2    I do housework such as vacuuming and dusting on my own without difficulty. 2    I can easily lift a gallon of milk (8lbs). 2    I can easily walk a mile. 1    I have no trouble reaching into high cupboards or reaching down to pick up something from the floor. 1    I do not have trouble doing out-door work such as Loss adjuster, chartered, raking leaves, or gardening. 1      Mobility and Daily Activities   I feel younger than my age. 2    I feel independent. 2    I feel energetic. 2    I live an active life.  2    I feel strong. 2    I feel healthy. 1    I feel active as other people my age. 1      How fit and strong are you.   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

## 2022-05-15 NOTE — Progress Notes (Signed)
[name] PREP Weekly Session  Patient Details  Name: Jessica [name] MRN: [id number] Date of Birth: 1956/02/05 Age: 67 y.o. PCP: Cipriano Mile, NP  There were no vitals filed for this visit.   [name] Weekly seesion - 05/15/22 1400       [name] "PREP" Location   [name] "PREP" Location [name]      Weekly Session   Topic Discussed Goal setting and welcome to the program   Introductions, review of notebook, Tour of facility; today's tip: avoid sitting no more than 30 minutes            Eulon Allnutt B Tyneka Scafidi 05/15/2022, 2:15 PM

## 2022-05-16 LAB — BASIC METABOLIC PANEL
BUN/Creatinine Ratio: 21 (ref 12–28)
BUN: 16 mg/dL (ref 8–27)
CO2: 17 mmol/L — ABNORMAL LOW (ref 20–29)
Calcium: 9.7 mg/dL (ref 8.7–10.3)
Chloride: 102 mmol/L (ref 96–106)
Creatinine, Ser: 0.75 mg/dL (ref 0.57–1.00)
Glucose: 51 mg/dL — ABNORMAL LOW (ref 70–99)
Potassium: 5 mmol/L (ref 3.5–5.2)
Sodium: 145 mmol/L — ABNORMAL HIGH (ref 134–144)
eGFR: 88 mL/min/{1.73_m2} (ref >59)

## 2022-05-16 LAB — SULFONYLUREA HYPOGLYCEMICS PANEL, SERUM
Acetohexamide: NEGATIVE ug/mL (ref 20–60)
Chlorpropamide: NEGATIVE ug/mL (ref 75–250)
Glimepiride: NEGATIVE ng/mL (ref 80–250)
Glipizide: NEGATIVE ng/mL (ref 200–1000)
Glyburide: NEGATIVE ng/mL
Nateglinide: NEGATIVE ng/mL
Repaglinide: NEGATIVE ng/mL
Tolazamide: NEGATIVE ug/mL
Tolbutamide: NEGATIVE ug/mL (ref 40–100)

## 2022-05-16 LAB — PROINSULIN: Proinsulin: 2.4 pmol/L (ref 0.0–10.0)

## 2022-05-16 LAB — BETA-HYDROXYBUTYRIC ACID: Beta-Hydroxybutyrate: 49 mg/dL — ABNORMAL HIGH

## 2022-05-16 LAB — INSULIN AND C-PEPTIDE, SERUM
C-Peptide: 0.6 ng/mL — ABNORMAL LOW (ref 1.1–4.4)
INSULIN: 2.9 u[IU]/mL (ref 2.6–24.9)

## 2022-05-17 ENCOUNTER — Encounter: Payer: Self-pay | Admitting: Internal Medicine

## 2022-05-21 ENCOUNTER — Telehealth: Payer: Self-pay | Admitting: Allergy and Immunology

## 2022-05-21 DIAGNOSIS — T17308D Unspecified foreign body in larynx causing other injury, subsequent encounter: Secondary | ICD-10-CM

## 2022-05-21 DIAGNOSIS — K224 Dyskinesia of esophagus: Secondary | ICD-10-CM

## 2022-05-21 NOTE — Telephone Encounter (Signed)
Patient called stating Dr. Neldon Mc wanted her to have a  barium swallow for choking/esophageal dysmotility she is asking for a referral for this issue please advise

## 2022-05-22 NOTE — Telephone Encounter (Signed)
Where do you want her referred too for the barium swallow?

## 2022-05-23 NOTE — Telephone Encounter (Signed)
Sent in order and called GI at the hospital to get them to get this going asap

## 2022-05-24 ENCOUNTER — Telehealth (HOSPITAL_COMMUNITY): Payer: Self-pay

## 2022-05-24 ENCOUNTER — Telehealth: Payer: Self-pay | Admitting: Allergy and Immunology

## 2022-05-24 NOTE — Telephone Encounter (Signed)
Attempted to contact patient to schedule Modified Barium Swallow and Esophagram (Will contact centralized scheduling once I speak with patient) - left voicemail.

## 2022-05-24 NOTE — Telephone Encounter (Signed)
Please see other encounter note.

## 2022-05-24 NOTE — Telephone Encounter (Signed)
Pt is requesting a call back about her barium swallow.

## 2022-05-24 NOTE — Telephone Encounter (Signed)
Please see Rachel's message.

## 2022-05-24 NOTE — Telephone Encounter (Signed)
Patient returning call for her barium swallow testing instructions please advise

## 2022-05-24 NOTE — Telephone Encounter (Signed)
Lvm letting patient know that we called the hospital to try and schedule her barium swallow and that we will call again in the morning. Number to call is [phone] at [facility]. Someone will call her back once the office is able to speak to someone in that department on her behalf.

## 2022-05-25 NOTE — Telephone Encounter (Signed)
I see in another encounter from yesterday 05/24/22 that Derrick Ravel with [facility] was attempting to reach the patient to get her scheduled for the Barium Swallow and left a voicemail for the patient to call her back to get that scheduled. I called and spoke with the patient and she stated that she did call back [facility] and left a voicemail for Jarrett Soho to call her back. I advised to call today and leave another voicemail and that I will call the patient Monday to follow up on if she was able to get that scheduled. Patient verbalized understanding. She states that she has an appointment this you this coming week on Tuesday and was wondering if she needed to push the appointment out since she has not had the Barium Swallow performed yet?

## 2022-05-29 ENCOUNTER — Other Ambulatory Visit: Payer: Self-pay

## 2022-05-29 ENCOUNTER — Ambulatory Visit (INDEPENDENT_AMBULATORY_CARE_PROVIDER_SITE_OTHER): Payer: Medicare Other | Admitting: Allergy and Immunology

## 2022-05-29 VITALS — BP 100/62 | HR 73 | Temp 97.5°F | Resp 14 | Ht 63.0 in | Wt 116.0 lb

## 2022-05-29 DIAGNOSIS — K219 Gastro-esophageal reflux disease without esophagitis: Secondary | ICD-10-CM

## 2022-05-29 DIAGNOSIS — J310 Chronic rhinitis: Secondary | ICD-10-CM | POA: Diagnosis not present

## 2022-05-29 DIAGNOSIS — R09A2 Foreign body sensation, throat: Secondary | ICD-10-CM | POA: Diagnosis not present

## 2022-05-29 DIAGNOSIS — K224 Dyskinesia of esophagus: Secondary | ICD-10-CM

## 2022-05-29 DIAGNOSIS — T17308D Unspecified foreign body in larynx causing other injury, subsequent encounter: Secondary | ICD-10-CM

## 2022-05-29 MED ORDER — PANTOPRAZOLE SODIUM 40 MG PO TBEC: 40.0000 mg | DELAYED_RELEASE_TABLET | Freq: Every morning | ORAL | 5 refills | Status: DC

## 2022-05-29 MED ORDER — FAMOTIDINE 40 MG PO TABS: 40.0000 mg | ORAL_TABLET | Freq: Every evening | ORAL | 5 refills | Status: DC

## 2022-05-29 NOTE — Patient Instructions (Signed)
  1. Continue to Treat reflux / LPR:   A.  Pantoprazole 40 mg -1 tablet in AM  B.  Famotidine 40 mg -1 tablet in p.m.  2.  Obtain modified barium swallow / barium swallow 07 June 2022  3. Obtain Visit with ENT to examine throat for LPR - we will make appointment  4. Return to clinic in 12 weeks or earlier if problem

## 2022-05-29 NOTE — Progress Notes (Signed)
[facility] PREP Weekly Session  Patient Details  Name: Jessica [name] MRN: [id number] Date of Birth: Apr 09, 1956 Age: 67 y.o. PCP: Cipriano Mile, NP  Vitals:   05/29/22 1346  Weight: 117 lb 3.2 oz (53.2 kg)     [facility] Weekly seesion - 05/29/22 1300       [facility] "PREP" Location   [facility] "PREP" Location [facility]      Weekly Session   Topic Discussed Importance of resistance training;Other ways to be active   Work up to 150 mins cardio/wk; strength training: 2-3 times/wk for 20-40 minutes   Classes attended to date [place] 05/29/2022, 1:48 PM

## 2022-05-29 NOTE — Progress Notes (Signed)
[facility]ica [name] - [place] [facility] [place]   Follow-up Note  Referring Provider: Cipriano Mile, NP Primary Provider: Cipriano Mile, NP Date of Office Visit: 05/29/2022  Subjective:   Jessica [name] (DOB: February 25, 1956) is a 67 y.o. female who returns to the [facility] on 05/29/2022 in re-evaluation of the following:  HPI: [name] returns to this clinic in evaluation of LPR, esophageal dysmotility, choking, and rhinitis.  I last saw her in this clinic 01 May 2022.  She actually thinks that she is doing a little better at this point in time.  She has less throat clearing and less lump in her throat and less obstructive swallowing events.  She does not really have that much choking.  She has been consistently using therapy directed against LPR.  She had very little problems with her upper airway while using Flonase and cetirizine.  Allergies as of 05/29/2022       Reactions   Motrin [ibuprofen] Swelling   Tongue swells   Sulfa Antibiotics Swelling   Tongue swells   Flagyl [metronidazole] Other (See Comments)   C-Diff   Amoxicillin-pot Clavulanate    Other reaction(s): Cramps (ALLERGY/intolerance) Abd cramping and diarrhea   Beta Adrenergic Blockers Other (See Comments), Nausea And Vomiting   Eye infection (only with eye drops), shortness of breath Certain eye gtts that have beta blocking   Contrast Media [iodinated Contrast Media] Other (See Comments)   Only use non-ionic solutions per md   Dorzolamide Hcl-timolol Mal Other (See Comments)   Burning sensation   Gluten Meal Other (See Comments)   Rectal itching, intestinal inflammation   Iodine    Other reaction(s): Other (See Comments) Pt unsure but cannot tolerate non-ionic solutions   Macrobid [nitrofurantoin Monohyd Macro] Itching   Oxycodone-acetaminophen Other (See Comments)   hallucinations   Oxycodone-aspirin Other (See Comments)   hallucinations   Sudafed [pseudoephedrine Hcl]     ?swollen tongue   Ultram [tramadol] Other (See Comments)   hallucinations   Wheat Bran Other (See Comments)   Rectal itching and burning inside of body (particularly on right side)   Sugar-protein-starch Rash, Other (See Comments)   Foggy thoughts, blood sugar fluctuates excessively   Yeast-related Products Rash        Medication List    albuterol 108 (90 Base) MCG/ACT inhaler Commonly known as: VENTOLIN HFA INHALE 2 PUFFS INTO THE LUNGS EVERY 4 HOURS AS NEEDED   atropine 1 % ophthalmic solution Place 1 drop into both eyes 3 (three) times daily.   b complex vitamins capsule Take 1 capsule by mouth daily.   Calcium Carb-Cholecalciferol 250-125 MG-UNIT Tabs Take by mouth.   cetirizine 10 MG tablet Commonly known as: ZYRTEC Take 10 mg by mouth daily.   cromolyn 100 MG/5ML solution Commonly known as: GASTROCROM Take by mouth.   cyclopentolate 1 % ophthalmic solution Commonly known as: CYCLODRYL,CYCLOGYL SMARTSIG:In Eye(s)   famotidine 40 MG tablet Commonly known as: PEPCID Take 1 tablet (40 mg total) by mouth at bedtime.   Flonase 50 MCG/ACT nasal spray Generic drug: fluticasone Place 2 sprays into both nostrils daily.   Magnesium Gluconate 500 (27 Mg) MG Tabs Take 500 mg by mouth daily.   montelukast 10 MG tablet Commonly known as: SINGULAIR TAKE 1 TABLET(10 MG) BY MOUTH AT BEDTIME   NON FORMULARY Take 1 Scoop by mouth 2 (two) times daily.   pantoprazole 40 MG tablet Commonly known as: Protonix Take 1 tablet (40 mg total) by mouth  every morning.   Prodigy Autocode Blood Glucose w/Device Kit 1 Device by Does not apply route daily in the afternoon.   Prodigy Autocode Blood Glucose Devi 1 Device by Does not apply route daily in the afternoon.   SYSTANE FREE OP Apply to eye.   Vitamin D3 125 MCG (5000 UT) Tabs Take 5,000 Units by mouth daily.    Past Medical History:  Diagnosis Date   Glaucoma    Hypoglycemia    Optic neuropathy     Past  Surgical History:  Procedure Laterality Date   ABDOMINAL HYSTERECTOMY     CESAREAN SECTION     EYE SURGERY     EYE SURGERY  10/11/2019   SMALL INTESTINE SURGERY  2002   TONSILLECTOMY     TUBAL LIGATION      Review of systems negative except as noted in HPI / PMHx or noted below:  Review of Systems  Constitutional: Negative.   HENT: Negative.    Eyes: Negative.   Respiratory: Negative.    Cardiovascular: Negative.   Gastrointestinal: Negative.   Genitourinary: Negative.   Musculoskeletal: Negative.   Skin: Negative.   Neurological: Negative.   Endo/Heme/Allergies: Negative.   Psychiatric/Behavioral: Negative.       Objective:   Vitals:   05/29/22 1129  BP: 100/62  Pulse: 73  Resp: 14  Temp: (!) 97.5 F (36.4 C)  SpO2: 99%   Height: 5\' 3"  (160 cm)  Weight: 116 lb (52.6 kg)   Physical Exam -deferred  Diagnostics: Results of blood tests obtained 01 May 2022 identifies WBC 4.2, absolute eosinophil 100, absolute lymphocyte 1900, hemoglobin 12.5, platelet 227, negative ANA.  Assessment and Plan:   1. LPRD (laryngopharyngeal reflux disease)   2. Esophageal dysmotility   3. Choking, subsequent encounter   4. Nonallergic rhinitis    1. Continue to Treat reflux / LPR:   A.  Pantoprazole 40 mg -1 tablet in AM  B.  Famotidine 40 mg -1 tablet in p.m.  2.  Obtain modified barium swallow / barium swallow 07 June 2022  3. Obtain Visit with ENT to examine throat for LPR - we will make appointment  4. Return to clinic in 12 weeks or earlier if problem  I think that [name] is finally starting to show some improvement regarding her LPR on her current plan of therapy directed against reflux.  She still has some issues with esophageal dysmotility and choking and we do need to follow through with her modified barium swallow and an esophagram on 07 June 2022.  And we will get her throat examined by a ENT doctor at some point in the near future.  Allena Katz,  MD Allergy / Immunology [place]

## 2022-05-30 ENCOUNTER — Encounter: Payer: Self-pay | Admitting: Allergy and Immunology

## 2022-06-05 ENCOUNTER — Other Ambulatory Visit: Payer: Self-pay | Admitting: Student

## 2022-06-05 ENCOUNTER — Telehealth: Payer: Self-pay

## 2022-06-05 DIAGNOSIS — R102 Pelvic and perineal pain: Secondary | ICD-10-CM

## 2022-06-05 DIAGNOSIS — Z139 Encounter for screening, unspecified: Secondary | ICD-10-CM

## 2022-06-05 NOTE — Telephone Encounter (Signed)
Patient LVM stating that she received her letter (sent on 1/4) and wanted to clarify that she does not eat a lot of carbs due to food sensitivity as well as during her last OV she was doing a 3 day spiritual fast which may be the cause of her hypoglycemia. States that she is concerned that her report to her pcp would reflect inaccurately as she feels as if her low blood glucose isnt due to poor eating habits.

## 2022-06-05 NOTE — Progress Notes (Signed)
[facility] PREP Weekly Session  Patient Details  Name: Jessica [name] MRN: [id number] Date of Birth: 01/26/1956 Age: 67 y.o. PCP: Cipriano Mile, NP  Vitals:   06/05/22 1345  Weight: 118 lb 9.6 oz (53.8 kg)     [facility] Weekly seesion - 06/05/22 1300       [facility] "PREP" Location   [facility] "PREP" Location [facility]      Weekly Session   Topic Discussed Healthy eating tips   Foods to reduce/foods to increase; introduced Yuka app   Classes attended to date [place] 06/05/2022, 1:46 PM

## 2022-06-06 ENCOUNTER — Other Ambulatory Visit: Payer: Self-pay | Admitting: Student

## 2022-06-06 DIAGNOSIS — E2839 Other primary ovarian failure: Secondary | ICD-10-CM

## 2022-06-07 ENCOUNTER — Ambulatory Visit (HOSPITAL_COMMUNITY)
Admission: RE | Admit: 2022-06-07 | Discharge: 2022-06-07 | Disposition: A | Discharge status: Home / Self Care | Payer: Medicare Other | Source: Ambulatory Visit | Attending: Student | Admitting: Student

## 2022-06-07 ENCOUNTER — Ambulatory Visit (HOSPITAL_COMMUNITY)
Admission: RE | Admit: 2022-06-07 | Discharge: 2022-06-07 | Disposition: A | Discharge status: Home / Self Care | Payer: Medicare Other | Source: Ambulatory Visit | Attending: Allergy and Immunology | Admitting: Allergy and Immunology

## 2022-06-07 DIAGNOSIS — T17308D Unspecified foreign body in larynx causing other injury, subsequent encounter: Secondary | ICD-10-CM | POA: Diagnosis present

## 2022-06-07 DIAGNOSIS — T17308A Unspecified foreign body in larynx causing other injury, initial encounter: Secondary | ICD-10-CM | POA: Diagnosis present

## 2022-06-07 DIAGNOSIS — K224 Dyskinesia of esophagus: Secondary | ICD-10-CM

## 2022-06-07 DIAGNOSIS — R1310 Dysphagia, unspecified: Secondary | ICD-10-CM | POA: Diagnosis not present

## 2022-06-07 NOTE — Evaluation (Signed)
Modified Barium Swallow Progress Note  Patient Details  Name: Jessica [name] MRN: [id number] Date of Birth: August 03, 1955  Today's Date: 06/07/2022  Modified Barium Swallow completed.  Full report located under Chart Review in the Imaging Section.  Brief recommendations include the following:  Clinical Impression  Pt's oral motor exam WFL, but oral phase appears to require increased effort to clear boluses. Piecemeal swallowing, poor cohesion, and reduced posterior propulsion were noted increasingly with thicker consistencies, although overall oral clearance was functional. Pt's UES appears to have reduced opening, which affects ability to clear boluses completely and causes trace amounts of esophageal residue and backflow into the pharynx x1 that is not cleared with multiple swallows. Suspect esophageal component is contributing to reported difficulties with swallowing. Recommend regular diet and thin liquids and no SLP f/u.   Swallow Evaluation Recommendations   Recommended Consults: Consider esophageal assessment;Consider GI evaluation   SLP Diet Recommendations: Regular solids;Thin liquid   Liquid Administration via: Cup;Straw   Medication Administration: Whole meds with liquid   Supervision: Patient able to self feed   Compensations: Slow rate;Small sips/bites   Postural Changes: Seated upright at 90 degrees;Remain semi-upright after after feeds/meals (Comment)   Oral Care Recommendations: Oral care BID       Fabio Asa., Student SLP 06/07/2022,1:40 PM

## 2022-06-12 NOTE — Progress Notes (Signed)
[facility] PREP Weekly Session  Patient Details  Name: Jessica [name] MRN: [id number] Date of Birth: 01/23/1956 Age: 67 y.o. PCP: Cipriano Mile, NP  Vitals:   06/12/22 1430  Weight: 116 lb 6.4 oz (52.8 kg)     [facility] Weekly seesion - 06/12/22 1400       [facility] "PREP" Location   [facility] "PREP" Location [place]      Weekly Session   Topic Discussed Health habits;Calorie breakdown   Sugar demo; apps: Yuka, my fitness pal, insight timer, [employer]   Minutes exercised this week 40 minutes    Classes attended to date [place] 06/12/2022, 2:30 PM

## 2022-06-19 NOTE — Progress Notes (Signed)
[facility] PREP Weekly Session  Patient Details  Name: Jessica [name] MRN: [id number] Date of Birth: 12/25/1955 Age: 67 y.o. PCP: Cipriano Mile, NP  Vitals:   06/19/22 1323  Weight: 117 lb 6.4 oz (53.3 kg)     [facility] Weekly seesion - 06/19/22 1300       [facility] "PREP" Location   [facility]PREP[facility] Location [facility]      Weekly Session   Topic Discussed Restaurant Eating   salt demo   Minutes exercised this week 80 minutes    Classes attended to date [place] 06/19/2022, 1:26 PM

## 2022-06-21 ENCOUNTER — Telehealth: Payer: Self-pay

## 2022-06-21 NOTE — Telephone Encounter (Signed)
I called patient to go over lab results. Patient did say that she does not have an ENT yet.

## 2022-06-21 NOTE — Telephone Encounter (Signed)
Can we please refer the patient to ENT for LPR and schedule an appointment for her? Patient will need to be made aware, thank you.

## 2022-06-21 NOTE — Telephone Encounter (Signed)
Refer to ENT

## 2022-06-26 NOTE — Progress Notes (Signed)
[facility] PREP Weekly Session  Patient Details  Name: Jessica [name] MRN: [id number] Date of Birth: 04-03-1956 Age: 67 y.o. PCP: Cipriano Mile, NP  Vitals:   06/26/22 1321  Weight: 117 lb 9.6 oz (53.3 kg)     [facility] Weekly seesion - 06/26/22 1300       [facility] "PREP" Location   [facility] "PREP" Location [facility]      Weekly Session   Topic Discussed Stress management and problem solving   importance of SLEEP; finger tip mudra meditation to help reduce stress   Minutes exercised this week 50 minutes    Classes attended to date [place] 06/26/2022, 1:22 PM

## 2022-07-02 ENCOUNTER — Telehealth: Payer: Self-pay | Admitting: Allergy and Immunology

## 2022-07-02 NOTE — Telephone Encounter (Signed)
Patient called and stated that she would like Dr. Neldon Mc to refer her to an ENT that he would go see himself. Patient also stated that she is switching gastro doctors and would like a referral to them too. Patients call back number is [phone]

## 2022-07-03 NOTE — Progress Notes (Signed)
[facility] PREP Weekly Session  Patient Details  Name: Jessica [name] MRN: [id number] Date of Birth: 10/06/1955 Age: 67 y.o. PCP: Cipriano Mile, NP  Vitals:   07/03/22 1305  Weight: 117 lb 12.8 oz (53.4 kg)     [facility] Weekly seesion - 07/03/22 1300       [facility] "PREP" Location   [facility] "PREP" Location [facility]      Weekly Session   Topic Discussed Expectations and non-scale victories;Finding support    Minutes exercised this week 155 minutes    Classes attended to date [place] 07/03/2022, 1:06 PM

## 2022-07-09 NOTE — Telephone Encounter (Signed)
Called patient - No DPR on file - LMOVM to contact office regarding her referral request.

## 2022-07-11 NOTE — Telephone Encounter (Signed)
Called and spoke with the patient and advised of referrals. Can we please refer the patient to GI with Dr. Havery Moros for Esophageal Dysmotility? [facility] ENT Referral has already been placed. Patient verbalized understanding.

## 2022-07-17 NOTE — Progress Notes (Signed)
[facility] PREP Weekly Session  Patient Details  Name: Jessica [name] MRN: [id number] Date of Birth: 05/28/1955 Age: 67 y.o. PCP: Cipriano Mile, NP  Vitals:   07/17/22 1514  Weight: 119 lb 9.6 oz (54.3 kg)     [facility] Weekly seesion - 07/17/22 1500       [facility] "PREP" Location   [facility] "PREP" Location [facility]      Weekly Session   Topic Discussed Finding support   staying positive   Minutes exercised this week 101 minutes    Classes attended to date [place] 07/17/2022, 3:16 PM

## 2022-07-24 NOTE — Progress Notes (Signed)
[facility] PREP Weekly Session  Patient Details  Name: Jessica [name] MRN: [id number] Date of Birth: 1956/01/31 Age: 67 y.o. PCP: Cipriano Mile, NP  There were no vitals filed for this visit.   [facility] Weekly seesion - 07/24/22 1300       [facility] "PREP" Location   [facility] "PREP" Location [facility]      Weekly Session   Topic Discussed Hitting roadblocks   Y membership talk w/Hahn; review of goals and activity plan and PREP survey to be completed for next Thursday's final assessment visit   Classes attended to date [place] 07/24/2022, 1:06 PM

## 2022-07-31 NOTE — Progress Notes (Signed)
[facility] PREP Weekly Session  Patient Details  Name: Jessica [name] MRN: [id number] Date of Birth: 12/21/1955 Age: 67 y.o. PCP: Cipriano Mile, NP  Vitals:   07/31/22 1305  Weight: 113 lb (51.3 kg)     [facility] Weekly seesion - 07/31/22 1300       [facility] "PREP" Location   [facility] "PREP" Location [facility]      Weekly Session   Topic Discussed Other   How fit and strong survey completed; final fit testing completed; appointments scheduled for final assessmenit visi this week.   Classes attended to date [place] 07/31/2022, 1:07 PM

## 2022-08-01 ENCOUNTER — Encounter: Payer: Self-pay | Admitting: Student

## 2022-08-02 NOTE — Progress Notes (Signed)
[facility] PREP Evaluation  Patient Details  Name: Brae Wiegmann MRN: [id number] Date of Birth: December 30, 1955 Age: 67 y.o. PCP: Cipriano Mile, NP  Vitals:   08/02/22 1247  BP: 98/60  Pulse: 72  SpO2: 99%  Weight: 112 lb 12.8 oz (51.2 kg)     [facility] Eval - 08/02/22 1200       [facility] "PREP" Location   [facility] "PREP" Location [facility]      Referral    Program End Date 08/02/22      Measurement   Waist Circumference End Program 27 inches    Hip Circumference End Program 34 inches    Body fat 20.7 percent      Mobility and Daily Activities   I find it easy to walk up or down two or more flights of stairs. 4    I have no trouble taking out the trash. 4    I do housework such as vacuuming and dusting on my own without difficulty. 3    I can easily lift a gallon of milk (8lbs). 4    I can easily walk a mile. 4    I have no trouble reaching into high cupboards or reaching down to pick up something from the floor. 3    I do not have trouble doing out-door work such as Armed forces logistics/support/administrative officer, raking leaves, or gardening. 3      Mobility and Daily Activities   I feel younger than my age. 4    I feel independent. 3    I feel energetic. 4    I live an active life.  3    I feel strong. 3    I feel healthy. 3    I feel active as other people my age. 3      How fit and strong are you.   Fit and Strong Total Score 48            Past Medical History:  Diagnosis Date   Glaucoma    Hypoglycemia    Optic neuropathy    Past Surgical History:  Procedure Laterality Date   ABDOMINAL HYSTERECTOMY     CESAREAN SECTION     EYE SURGERY     EYE SURGERY  10/11/2019   SMALL INTESTINE SURGERY  2002   TONSILLECTOMY     TUBAL LIGATION     Social History   Tobacco Use  Smoking Status Former   Types: Cigarettes   Quit date: 1985   Years since quitting: 39.2  Smokeless Tobacco Never  Wt loss: 10.8 lbs Inches lost: 4 Encouraged to increase protein intake along with calcium rich foods How  fit and strong survey: 05/11/23: 23 08/02/22: 48 Education sessions attended: 11 Strength training sessions completed: 11  Esvin Hnat B Madalee Altmann 08/02/2022, 12:49 PM

## 2022-08-21 ENCOUNTER — Ambulatory Visit: Admitting: Allergy and Immunology

## 2022-10-01 NOTE — Telephone Encounter (Signed)
I called and left a detailed voicemail to inform the patient that their offices have tried to contact her multiple times.    [facility] [address] [phone] [address], [place] [address] [phone] Phone [phone])  &   [facility] [address] [place] [address]rd Floor [place],  [place]  [address] Phone: [phone] Fax: [phone]

## 2022-10-01 NOTE — Telephone Encounter (Signed)
Patient called and stated she wanted to know where she was being referred to for ENT and GI doctor.

## 2022-10-09 LAB — COLOGUARD: COLOGUARD: NEGATIVE

## 2022-11-06 ENCOUNTER — Other Ambulatory Visit: Payer: Self-pay

## 2022-11-06 ENCOUNTER — Encounter: Payer: Self-pay | Admitting: Allergy and Immunology

## 2022-11-06 ENCOUNTER — Ambulatory Visit (INDEPENDENT_AMBULATORY_CARE_PROVIDER_SITE_OTHER): Payer: Medicare Other | Admitting: Allergy and Immunology

## 2022-11-06 VITALS — BP 100/70 | HR 74 | Temp 97.5°F | Resp 16 | Ht 63.0 in | Wt 114.8 lb

## 2022-11-06 DIAGNOSIS — J310 Chronic rhinitis: Secondary | ICD-10-CM

## 2022-11-06 DIAGNOSIS — T17308D Unspecified foreign body in larynx causing other injury, subsequent encounter: Secondary | ICD-10-CM | POA: Diagnosis not present

## 2022-11-06 DIAGNOSIS — K219 Gastro-esophageal reflux disease without esophagitis: Secondary | ICD-10-CM

## 2022-11-06 MED ORDER — FAMOTIDINE 40 MG PO TABS: 40.0000 mg | ORAL_TABLET | Freq: Two times a day (BID) | ORAL | 1 refills | Status: DC

## 2022-11-06 NOTE — Patient Instructions (Addendum)
  1. Continue to Treat reflux / LPR:   A. Famotidine 40 mg -1 tablet 2 times per day (No pantoprazole)  2.  Metal testing??? Let me investigate  3. Return to clinic in 6 months or earlier if problem  4. Plan for fall flu vaccine

## 2022-11-06 NOTE — Progress Notes (Signed)
[place] [facility] [place] [facility] [place] [facility] [place]   Follow-up Note  Referring Provider: Hillery Aldo, NP Primary Provider: Hillery Aldo, NP Date of Office Visit: 11/06/2022  Subjective:   Jessica [name] (DOB: 10-Mar-1956) is a 67 y.o. female [facility] in re-evaluation of the following:  HPI: Jessica [name] returns to this clinic in evaluation of LPR, choking, rhinitis.  I last saw her in this clinic 29 May 2022.  She thinks that she is doing better at this point in time regarding her throat.  She is not as hoarse and she does not have as much mucus and she thinks her swallowing is somewhat better.  She believes that the use of famotidine works better than pantoprazole to treat her reflux.  She questions whether or not she is having some problems with the feelings in her teeth.  She wonders if she has some type of metal allergy.  She would like to be tested for metal allergy.  Allergies as of 11/06/2022       Reactions   Motrin [ibuprofen] Swelling   Tongue swells   Sulfa Antibiotics Swelling   Tongue swells   Flagyl [metronidazole] Other (See Comments)   C-Diff   Amoxicillin-pot Clavulanate    Other reaction(s): Cramps (ALLERGY/intolerance) Abd cramping and diarrhea   Beta Adrenergic Blockers Other (See Comments), Nausea And Vomiting   Eye infection (only with eye drops), shortness of breath Certain eye gtts that have beta blocking   Contrast Media [iodinated Contrast Media] Other (See Comments)   Only use non-ionic solutions per md   Dorzolamide Hcl-timolol Mal Other (See Comments)   Burning sensation   Gluten Meal Other (See Comments)   Rectal itching, intestinal inflammation   Iodine    Other reaction(s): Other (See Comments) Pt unsure but cannot tolerate non-ionic solutions   Macrobid [nitrofurantoin Monohyd Macro] Itching   Oxycodone-acetaminophen Other (See Comments)   hallucinations   Oxycodone-aspirin Other  (See Comments)   hallucinations   Sudafed [pseudoephedrine Hcl]    ?swollen tongue   Ultram [tramadol] Other (See Comments)   hallucinations   Wheat Other (See Comments)   Rectal itching and burning inside of body (particularly on right side)   Sugar-protein-starch Rash, Other (See Comments)   Foggy thoughts, blood sugar fluctuates excessively   Yeast-related Products Rash        Medication List    albuterol 108 (90 Base) MCG/ACT inhaler Commonly known as: VENTOLIN HFA INHALE 2 PUFFS INTO THE LUNGS EVERY 4 HOURS AS NEEDED   atropine 1 % ophthalmic solution Place 1 drop into both eyes 3 (three) times daily.   b complex vitamins capsule Take 1 capsule by mouth daily.   Calcium Carb-Cholecalciferol 250-125 MG-UNIT Tabs Take by mouth.   cetirizine 10 MG tablet Commonly known as: ZYRTEC Take 10 mg by mouth daily.   cromolyn 100 MG/5ML solution Commonly known as: GASTROCROM Take by mouth.   cyclopentolate 1 % ophthalmic solution Commonly known as: CYCLODRYL,CYCLOGYL SMARTSIG:In Eye(s)   famotidine 40 MG tablet Commonly known as: PEPCID Take 1 tablet (40 mg total) by mouth at bedtime.   Flonase 50 MCG/ACT nasal spray Generic drug: fluticasone Place 2 sprays into both nostrils daily.   Magnesium Gluconate 500 (27 Mg) MG Tabs Take 500 mg by mouth daily.   montelukast 10 MG tablet Commonly known as: SINGULAIR TAKE 1 TABLET(10 MG) BY MOUTH AT BEDTIME   NON FORMULARY Take 1 Scoop by mouth 2 (  two) times daily.   pantoprazole 40 MG tablet Commonly known as: Protonix Take 1 tablet (40 mg total) by mouth every morning.   Prodigy Autocode Blood Glucose w/Device Kit 1 Device by Does not apply route daily in the afternoon.   Prodigy Autocode Blood Glucose Devi 1 Device by Does not apply route daily in the afternoon.   SYSTANE FREE OP Apply to eye.   Vitamin D3 125 MCG (5000 UT) Tabs Take 5,000 Units by mouth daily.      Past Medical History:  Diagnosis  Date  . Glaucoma   . Hypoglycemia   . Optic neuropathy     Past Surgical History:  Procedure Laterality Date  . ABDOMINAL HYSTERECTOMY    . CESAREAN SECTION    . EYE SURGERY    . EYE SURGERY  10/11/2019  . SMALL INTESTINE SURGERY  2002  . TONSILLECTOMY    . TUBAL LIGATION      Review of systems negative except as noted in HPI / PMHx or noted below:  Review of Systems  Constitutional: Negative.   HENT: Negative.    Eyes: Negative.   Respiratory: Negative.    Cardiovascular: Negative.   Gastrointestinal: Negative.   Genitourinary: Negative.   Musculoskeletal: Negative.   Skin: Negative.   Neurological: Negative.   Endo/Heme/Allergies: Negative.   Psychiatric/Behavioral: Negative.       Objective:   There were no vitals filed for this visit.        Physical Exam Constitutional:      Appearance: She is not diaphoretic.  HENT:     Head: Normocephalic.     Right Ear: Tympanic membrane, ear canal and external ear normal.     Left Ear: Tympanic membrane, ear canal and external ear normal.     Nose: Nose normal. No mucosal edema or rhinorrhea.     Mouth/Throat:     Pharynx: Uvula midline. No oropharyngeal exudate.  Eyes:     Conjunctiva/sclera: Conjunctivae normal.  Neck:     Thyroid: No thyromegaly.     Trachea: Trachea normal. No tracheal tenderness or tracheal deviation.  Cardiovascular:     Rate and Rhythm: Normal rate and regular rhythm.     Heart sounds: Normal heart sounds, S1 normal and S2 normal. No murmur heard. Pulmonary:     Effort: No respiratory distress.     Breath sounds: Normal breath sounds. No stridor. No wheezing or rales.  Lymphadenopathy:     Head:     Right side of head: No tonsillar adenopathy.     Left side of head: No tonsillar adenopathy.     Cervical: No cervical adenopathy.  Skin:    Findings: No erythema or rash.     Nails: There is no clubbing.  Neurological:     Mental Status: She is alert.    Diagnostics:   Result of  a modified barium swallow obtained 07 June 2022 identified the following:  Pt's oral motor exam WFL, but oral phase appears to require increased effort to clear boluses. Piecemeal swallowing, poor cohesion, and reduced posterior propulsion were noted increasingly with thicker consistencies, although overall oral clearance was functional. Pt's UES appears to have reduced opening, which affects ability to clear boluses completely and causes trace amounts of esophageal residue and backflow into the pharynx x1 that is not cleared with multiple swallows. Suspect esophageal component is contributing to reported difficulties with swallowing. Recommend regular diet and thin liquids and no SLP f/u.   Assessment and Plan:  No diagnosis found.  Patient Instructions   1. Continue to Treat reflux / LPR:   A. Famotidine 40 mg -1 tablet 2 times per day (No pantoprazole)  2.  Metal testing??? Let me investigate  3. Return to clinic in 6 months or earlier if problem  4. Plan for fall flu vaccine  Laurette Schimke, MD Allergy / Immunology [place]

## 2022-11-07 ENCOUNTER — Telehealth: Payer: Self-pay

## 2022-11-07 ENCOUNTER — Encounter: Payer: Self-pay | Admitting: Allergy and Immunology

## 2022-11-07 NOTE — Telephone Encounter (Signed)
Kozlow, Alvira Philips, MD  P [facility] [place] Clinical Caller: Unspecified (Today,  6:37 AM) Please let Brandii know that she can arrange for metal patch testing in [place]  Lm for pt to call us back to get scheduled

## 2022-11-12 NOTE — Telephone Encounter (Signed)
I called and was able to schedule 3 appointments for metal patch testing at the [facility] office.  Monday 11/19/22 at 2pm, Wednesday 11/21/22 at 11:20pm, and Monday 11/26/22 at 2:00pm for a final reading.    It looks like there will be one provider for Friday 11/23/22 at the [facility] office. Please advise if I am able to double book the patient for a metal patch reading and the time that is acceptable. I told the patient I would call her back once I knew when I could make that Friday appointment.

## 2022-11-13 NOTE — Telephone Encounter (Signed)
Patient has been notified and appointment for 11/23/22 has been made for 2pm per Dr. Delorse Lek.

## 2022-11-19 ENCOUNTER — Ambulatory Visit (INDEPENDENT_AMBULATORY_CARE_PROVIDER_SITE_OTHER): Payer: Medicare Other | Admitting: Family Medicine

## 2022-11-19 ENCOUNTER — Other Ambulatory Visit: Payer: Self-pay

## 2022-11-19 ENCOUNTER — Encounter: Payer: Self-pay | Admitting: Family Medicine

## 2022-11-19 VITALS — BP 100/60 | HR 65 | Temp 98.5°F | Resp 16 | Wt 116.1 lb

## 2022-11-19 DIAGNOSIS — L230 Allergic contact dermatitis due to metals: Secondary | ICD-10-CM

## 2022-11-19 NOTE — Patient Instructions (Signed)
Plan:   Allergic contact dermatitis - Instructions provided on care of the patches for the next 48 hours. Jessica [name] was instructed to avoid showering for the next 48 hours. Jessica [name] will follow up in 48 hours, 96 hours, and 7 days for patch readings.    Call the clinic if this treatment plan is not working well for you  Follow up in 2 days or sooner if needed.

## 2022-11-19 NOTE — Progress Notes (Signed)
Follow-up Note  RE: Jessica [name] MRN: [id number] DOB: [birth year] Date of Office Visit: 11/19/2022  Primary care provider: Hillery Aldo, NP Referring provider: Hillery Aldo, NP   Arlean returns to the office today for the patch test placement, given suspected history of contact dermatitis.    Diagnostics: Metals patch testing placed.   Metals Patch     Time Antigen Placed  11/19/2022     Location  Back    Number of Test  11    Reading Interval  Placed    Chromium chloride 1%  0     Cobalt chloride hexahydrate 1%  0     Molybdenum chloride 0.5%  0     Nickel sulfate hexahydrate 5%  0     Potassium dichromate 0.25%  0     Copper sulfate pentahydrate 2%  0     Titanium 0.1%  0     Manganese chloride 0.5%  0     Tantal 1%  0    Vanadium pentoxide 10%  0   Aluminum hydroxide 10%  0    Comments  N/A    Plan:   Allergic contact dermatitis - Instructions provided on care of the patches for the next 48 hours. Jessica [name] was instructed to avoid showering for the next 48 hours. Jessica [name] will follow up in 48 hours, 96 hours, and 7 days for patch readings.    Call the clinic if this treatment plan is not working well for you  Follow up in 2 days or sooner if needed.

## 2022-11-19 NOTE — Addendum Note (Signed)
Addended by: Rolland Bimler D on: 11/19/2022 04:46 PM   Modules accepted: Orders

## 2022-11-21 ENCOUNTER — Encounter: Payer: Self-pay | Admitting: Allergy

## 2022-11-21 ENCOUNTER — Other Ambulatory Visit: Payer: Self-pay

## 2022-11-21 ENCOUNTER — Ambulatory Visit (INDEPENDENT_AMBULATORY_CARE_PROVIDER_SITE_OTHER): Payer: Medicare Other | Admitting: Allergy

## 2022-11-21 DIAGNOSIS — L230 Allergic contact dermatitis due to metals: Secondary | ICD-10-CM

## 2022-11-21 NOTE — Progress Notes (Signed)
    Follow-up Note  RE: Jessica [name] MRN: [id number] DOB: July 04, 1955 Date of Office Visit: 11/21/2022  Primary care provider: Hillery Aldo, NP Referring provider: Hillery Aldo, NP   [name] returns to the office today for the initial metal patch test interpretation, given suspected history of contact dermatitis.    Diagnostics:  Metal series 48 hour reading:   Metals Patch     Row Name 11/21/22 1100           Manufacturer Greer       Location Back       Number of Test 11       Reading Interval Day 3       Aluminum Hydroxide 10% 0       Chromium chloride 1% 0       Cobalt chloride hexahydrate 1% 0       Molybdenum chloride 0.5% 0       Nickel sulfate hexahydrate 5% 0       Potassium dichromate 0.25% 0       Copper sulfate pentahydrate 2% 0       Tantal 1% 0       Titanium 0.1% 0       Manganese chloride 0.5% 0       Vanadium Pentoxide 10% 0                   Plan:  Allergic contact dermatitis Metal patch testing is negative today.  She will return in 2 days and again at 7 days for readings.   Margo Aye, MD [facility]

## 2022-11-23 ENCOUNTER — Ambulatory Visit: Payer: Medicare Other | Admitting: Allergy

## 2022-11-23 ENCOUNTER — Encounter: Payer: Self-pay | Admitting: Allergy

## 2022-11-23 DIAGNOSIS — L230 Allergic contact dermatitis due to metals: Secondary | ICD-10-CM

## 2022-11-23 NOTE — Progress Notes (Signed)
    Follow-up Note  RE: Jasreet Ace MRN: [id number] DOB: Sep 29, 1955 Date of Office Visit: 11/23/2022  Primary care provider: Hillery Aldo, NP Referring provider: Hillery Aldo, NP   Vannya returns to the office today for the Metal series patch test interpretation, given suspected history of contact dermatitis.  She states she has noted some burning and itching of the back since the patches were removed.   Diagnostics:  Metal series 96 hour reading:  Metals Patch  Row Name 11/23/22 1400 Manufacturer Greer Location Back Number of Test 11 Reading Interval Day 5 Aluminum Hydroxide 10% 0 Chromium chloride 1% 0 Cobalt chloride hexahydrate 1% 0 Molybdenum chloride 0.5% 0 Nickel sulfate hexahydrate 5% 0 Potassium dichromate 0.25% 0 Copper sulfate pentahydrate 2% 0 Tantal 1% 0 Titanium 0.1% 0 Manganese chloride 0.5% 0 Vanadium Pentoxide 10% 0       Plan:  Allergic contact dermatitis Metal series remains negative at 96 hours.  She return on Monday for a 7-day reading.  Margo Aye, MD [facility]

## 2022-11-26 ENCOUNTER — Encounter: Payer: Self-pay | Admitting: Family Medicine

## 2022-11-26 ENCOUNTER — Ambulatory Visit: Payer: Medicare Other | Admitting: Family Medicine

## 2022-11-26 DIAGNOSIS — L230 Allergic contact dermatitis due to metals: Secondary | ICD-10-CM

## 2022-11-26 NOTE — Patient Instructions (Addendum)
Diagnostics:   Metals patch testing: Day 7   Metals Patch     Time Antigen Placed  11/19/2022    Location  Back    Number of Test  11    Reading Interval  Day 7    Chromium chloride 1%  0     Cobalt chloride hexahydrate 1%  0     Molybdenum chloride 0.5%  0     Nickel sulfate hexahydrate 5%  0     Potassium dichromate 0.25%  0     Copper sulfate pentahydrate 2%  0     Titanium 0.1%  0     Manganese chloride 0.5%  0     Tantal 1%  0    Vanadium pentoxide 10%  0   Aluminum hydroxide 10%  0    Comments  All negative    Plan:  All metals tested were negative at all readings  Call the clinic if this treatment plan is not working well for you  Follow up as needed

## 2022-11-26 NOTE — Progress Notes (Signed)
    Follow-up Note  RE: Jessica [name] MRN: [id number] DOB: 17-Dec-1955 Date of Office Visit: 11/26/2022  Primary care provider: Hillery Aldo, NP Referring provider: Hillery Aldo, NP   [name] returns to the office today for the final patch test interpretation, given suspected history of contact dermatitis.    Diagnostics:   Metals patch testing: Day 7   Metals Patch     Time Antigen Placed  11/19/2022    Location  Back    Number of Test  11    Reading Interval  Day 7    Chromium chloride 1%  0     Cobalt chloride hexahydrate 1%  0     Molybdenum chloride 0.5%  0     Nickel sulfate hexahydrate 5%  0     Potassium dichromate 0.25%  0     Copper sulfate pentahydrate 2%  0     Titanium 0.1%  0     Manganese chloride 0.5%  0     Tantal 1%  0    Vanadium pentoxide 10%  0   Aluminum hydroxide 10%  0    Comments  All negative    Plan:  All metals tested were negative at all readings  Call the clinic if this treatment plan is not working well for you  Follow up as needed   Thank you for the opportunity to care for this patient.  Please do not hesitate to contact me with questions.  Thermon Leyland, FNP [facility]

## 2023-01-09 ENCOUNTER — Telehealth: Payer: Self-pay | Admitting: Allergy and Immunology

## 2023-01-09 NOTE — Telephone Encounter (Signed)
Patient had an allergic reaction due to a dental procedure. I called the patient but was unable to leave a message. I will call back on 01/10/23.

## 2023-01-09 NOTE — Telephone Encounter (Signed)
Patient request a call back, she had an allergic reaction and her mouth was swollen possibly from a dental procedure. The dentist advised that she be prescribed an Epi-pen since the reaction was pretty bad. Patient is leaving town in the morning and wanted to know if she could get that Epi- pen before she leaves or if she needs to come in.

## 2023-01-10 NOTE — Telephone Encounter (Signed)
I called and spoke with the patient and she stated that she had a Periodontal cleaning yesterday. She stated that later that night she had dinner and prepared Arugula along with her dinner which is something she has not had in a while. She states that after she brushed her teeth she experience swelling of the lips, tongue, and gums. She stated that she couldn't swallow and experienced some wheezing. She had to go out of town this morning for a death in the family and is currently residing in [place]. She stated that today her lips are still tender and sour and swollen with some rash, and her voice is sore. I could hear her clearing her throat on the phone. I did advise to go ahead and purchase some Benadryl and advised to take 1-2 capsules every 4-6 hours. Patient verbalized understanding. She is also wondering if she should have an albuterol inhaler sent in since she was wheezing.

## 2023-01-11 NOTE — Telephone Encounter (Signed)
She does not have an epipen. She is just in [place] for a funeral until next Tuesday. She will go to urgent care today!   [facility] [address] [place]

## 2023-01-11 NOTE — Telephone Encounter (Signed)
Pt hasn't went to [facility] yet but did make appt for the 10th

## 2023-01-22 ENCOUNTER — Ambulatory Visit (INDEPENDENT_AMBULATORY_CARE_PROVIDER_SITE_OTHER): Payer: Medicare Other | Admitting: Allergy and Immunology

## 2023-01-22 VITALS — BP 102/68 | HR 84 | Temp 97.5°F | Resp 16 | Ht 62.0 in | Wt 104.0 lb

## 2023-01-22 DIAGNOSIS — T781XXD Other adverse food reactions, not elsewhere classified, subsequent encounter: Secondary | ICD-10-CM

## 2023-01-22 DIAGNOSIS — K219 Gastro-esophageal reflux disease without esophagitis: Secondary | ICD-10-CM | POA: Diagnosis not present

## 2023-01-22 MED ORDER — FLUTICASONE PROPIONATE 50 MCG/ACT NA SUSP: 2.0000 | Freq: Every day | NASAL | 1 refills | Status: DC

## 2023-01-22 MED ORDER — FAMOTIDINE 40 MG PO TABS: 40.0000 mg | ORAL_TABLET | Freq: Two times a day (BID) | ORAL | 1 refills | Status: DC

## 2023-01-22 MED ORDER — CETIRIZINE HCL 10 MG PO TABS: 10.0000 mg | ORAL_TABLET | Freq: Every day | ORAL | 1 refills | Status: DC | PRN

## 2023-01-22 MED ORDER — MONTELUKAST SODIUM 10 MG PO TABS: 10.0000 mg | ORAL_TABLET | Freq: Every evening | ORAL | 1 refills | Status: DC

## 2023-01-22 MED ORDER — ALBUTEROL SULFATE HFA 108 (90 BASE) MCG/ACT IN AERS: 2.0000 | INHALATION_SPRAY | RESPIRATORY_TRACT | 1 refills | Status: AC | PRN

## 2023-01-22 NOTE — Patient Instructions (Addendum)
  1. Continue to Treat reflux / LPR:   A. Famotidine 40 mg -1 tablet 2 times per day  2. Contact clinic if recurrent reactions.  3. Blood - IgE to apple, peach, pear, arugula, beans, pecans  4. Return to clinic in 6 months or earlier if problem  5. Plan for fall flu vaccine

## 2023-01-22 NOTE — Progress Notes (Unsigned)
[place] - [place] - [place] - [place] - [place]   Follow-up Note  Referring Provider: Hillery Aldo, NP Primary Provider: Hillery Aldo, NP Date of Office Visit: 01/22/2023  Subjective:   Jessica [name] (DOB: 17-Mar-1956) is a 67 y.o. female who [facility] in re-evaluation of the following:  HPI: Jessica [name] returns to this clinic in evaluation of LPR, choking, rhinitis and a recent allergic reaction.  I last saw her in this clinic 06 November 2022.  She is doing relatively well at this point in time regarding all of her throat issue and swallowing issue while using famotidine on a consistent basis.  But, she had a few things that have developed recently.  First, about 3 weeks ago she became ill with a pounding headache, ringing in her ears, eyes on fire, back pain, coughing, raspy voice, and extreme weakness for 48 hours and then she had a lingering respiratory tract event that is still active today although certainly much improved.  Currently she just has a slight cough and some dryness in her throat but all of her other symptoms have resolved.  Second, summer around 2 weeks ago she went to the dentist and had a cleaning.  She then went home and ate arugula.  She then brushed her teeth later that afternoon.  Then she felt as though her gums and her lips were swelling.  She took a salt water gargle and then she took some oral cromolyn that was given to her by a functional medicine doctor.  Her lip issue felt much better after about 48 hours and was never associated with any systemic or constitutional symptoms.  And she also tells me that when she eats peaches and apples and pecans and beans her nails get shredded..  Allergies as of 01/22/2023       Reactions   Motrin [ibuprofen] Swelling   Tongue swells   Sulfa Antibiotics Swelling   Tongue swells   Flagyl [metronidazole] Other (See Comments)   C-Diff   Amoxicillin-pot Clavulanate     Other reaction(s): Cramps (ALLERGY/intolerance) Abd cramping and diarrhea   Beta Adrenergic Blockers Other (See Comments), Nausea And Vomiting   Eye infection (only with eye drops), shortness of breath Certain eye gtts that have beta blocking   Contrast Media [iodinated Contrast Media] Other (See Comments)   Only use non-ionic solutions per md   Dorzolamide Hcl-timolol Mal Other (See Comments)   Burning sensation   Gluten Meal Other (See Comments)   Rectal itching, intestinal inflammation   Iodine    Other reaction(s): Other (See Comments) Pt unsure but cannot tolerate non-ionic solutions   Macrobid [nitrofurantoin Monohyd Macro] Itching   Oxycodone-acetaminophen Other (See Comments)   hallucinations   Oxycodone-aspirin Other (See Comments)   hallucinations   Sudafed [pseudoephedrine Hcl]    ?swollen tongue   Ultram [tramadol] Other (See Comments)   hallucinations   Wheat Other (See Comments)   Rectal itching and burning inside of body (particularly on right side)   Sugar-protein-starch Rash, Other (See Comments)   Foggy thoughts, blood sugar fluctuates excessively   Yeast-related Products Rash        Medication List    albuterol 108 (90 Base) MCG/ACT inhaler Commonly known as: VENTOLIN HFA INHALE 2 PUFFS INTO THE LUNGS EVERY 4 HOURS AS NEEDED   b complex vitamins capsule Take 1 capsule by mouth daily.   cetirizine 10 MG tablet Commonly known as: ZYRTEC Take 10 mg by mouth  daily.   cromolyn 100 MG/5ML solution Commonly known as: GASTROCROM Take by mouth.   cyclopentolate 1 % ophthalmic solution Commonly known as: CYCLODRYL,CYCLOGYL SMARTSIG:In Eye(s)   erythromycin ophthalmic ointment 1 Application at bedtime.   Magnesium Gluconate 500 (27 Mg) MG Tabs Take 500 mg by mouth daily.   montelukast 10 MG tablet Commonly known as: SINGULAIR TAKE 1 TABLET(10 MG) BY MOUTH AT BEDTIME   NON FORMULARY Take 1 Scoop by mouth 2 (two) times daily.   Omega-3 1000 MG  Caps Take 1 capsule by mouth daily.   pantoprazole 40 MG tablet Commonly known as: Protonix Take 1 tablet (40 mg total) by mouth every morning.   SYSTANE FREE OP Apply to eye.   Vitamin D3 125 MCG (5000 UT) Tabs Take 5,000 Units by mouth daily.    Past Medical History:  Diagnosis Date   Glaucoma    Hypoglycemia    Optic neuropathy     Past Surgical History:  Procedure Laterality Date   ABDOMINAL HYSTERECTOMY     CESAREAN SECTION     EYE SURGERY     EYE SURGERY  10/11/2019   SMALL INTESTINE SURGERY  2002   TONSILLECTOMY     TUBAL LIGATION      Review of systems negative except as noted in HPI / PMHx or noted below:  Review of Systems  Constitutional: Negative.   HENT: Negative.    Eyes: Negative.   Respiratory: Negative.    Cardiovascular: Negative.   Gastrointestinal: Negative.   Genitourinary: Negative.   Musculoskeletal: Negative.   Skin: Negative.   Neurological: Negative.   Endo/Heme/Allergies: Negative.   Psychiatric/Behavioral: Negative.       Objective:   Vitals:   01/22/23 1542  BP: 102/68  Pulse: 84  Resp: 16  Temp: (!) 97.5 F (36.4 C)  SpO2: 100%   Height: 5\' 2"  (157.5 cm)  Weight: 104 lb (47.2 kg)   Physical Exam Constitutional:      Appearance: She is not diaphoretic.  HENT:     Head: Normocephalic.     Right Ear: Tympanic membrane, ear canal and external ear normal.     Left Ear: Tympanic membrane, ear canal and external ear normal.     Nose: Nose normal. No mucosal edema or rhinorrhea.     Mouth/Throat:     Pharynx: Uvula midline. No oropharyngeal exudate.  Eyes:     Conjunctiva/sclera: Conjunctivae normal.  Neck:     Thyroid: No thyromegaly.     Trachea: Trachea normal. No tracheal tenderness or tracheal deviation.  Cardiovascular:     Rate and Rhythm: Normal rate and regular rhythm.     Heart sounds: Normal heart sounds, S1 normal and S2 normal. No murmur heard. Pulmonary:     Effort: No respiratory distress.      Breath sounds: Normal breath sounds. No stridor. No wheezing or rales.  Lymphadenopathy:     Head:     Right side of head: No tonsillar adenopathy.     Left side of head: No tonsillar adenopathy.     Cervical: No cervical adenopathy.  Skin:    Findings: No erythema or rash.     Nails: There is no clubbing.  Neurological:     Mental Status: She is alert.     Diagnostics: none   Assessment and Plan:   1. Adverse food reaction, subsequent encounter   2. LPRD (laryngopharyngeal reflux disease)    1. Continue to Treat reflux / LPR:   A. Famotidine  40 mg -1 tablet 2 times per day  2. Contact clinic if recurrent reactions.  3. Blood - IgE to apple, peach, pear, arugula, beans, pecans  4. Return to clinic in 6 months or earlier if problem  5. Plan for fall flu vaccine  Indigo has had a good response to the use of famotidine regarding her LPR.  She has had some very unusual issues that of occurred recently.  I think that she developed some type of viral respiratory tract infection that fortunately appears to be resolving but in the middle that respiratory tract infection she had this allergic reaction and giving rise to gum and lip swelling that may have been associated with a cleaning agent utilized while at the dentist or may have been associated with the consumption of arugula, and she also has this issue with having nail dysfunction when she eats certain fruits and will see if she is allergic to any of those foods by checking the blood test noted above.  Laurette Schimke, MD Allergy / Immunology [facility]

## 2023-01-23 ENCOUNTER — Encounter: Payer: Self-pay | Admitting: Allergy and Immunology

## 2023-01-24 LAB — ALLERGEN PEACH F95: Allergen, Peach f95: <0.1 kU/L

## 2023-01-24 LAB — F094-IGE PEAR: Allergen Pear IgE: <0.1 kU/L

## 2023-01-24 LAB — ALLERGEN, APPLE F49: Allergen Apple, IgE: <0.1 kU/L

## 2023-01-26 LAB — ALLERGEN, BEAN BLACK
Black Bean*, IgE: <0.35 kU/L (ref <0.35)
Class Interpretation: 0

## 2023-01-26 LAB — ALLERGEN, KIDNEY BEAN, RF287: Kidney Bean IgE: <0.1 kU/L

## 2023-01-26 LAB — ALLERGEN PECAN F201: Pecan Nut IgE: <0.1 kU/L

## 2023-04-30 ENCOUNTER — Ambulatory Visit: Admitting: Allergy and Immunology

## 2023-06-04 ENCOUNTER — Other Ambulatory Visit: Payer: Self-pay | Admitting: Allergy and Immunology

## 2023-06-14 ENCOUNTER — Other Ambulatory Visit: Payer: Self-pay | Admitting: Allergy and Immunology

## 2023-07-23 ENCOUNTER — Other Ambulatory Visit: Payer: Self-pay

## 2023-07-23 ENCOUNTER — Ambulatory Visit (INDEPENDENT_AMBULATORY_CARE_PROVIDER_SITE_OTHER): Payer: Medicare Other | Admitting: Allergy and Immunology

## 2023-07-23 VITALS — BP 110/64 | HR 67 | Temp 98.0°F | Resp 16 | Ht 63.0 in | Wt 113.8 lb

## 2023-07-23 DIAGNOSIS — B9789 Other viral agents as the cause of diseases classified elsewhere: Secondary | ICD-10-CM

## 2023-07-23 DIAGNOSIS — H6992 Unspecified Eustachian tube disorder, left ear: Secondary | ICD-10-CM

## 2023-07-23 DIAGNOSIS — J988 Other specified respiratory disorders: Secondary | ICD-10-CM | POA: Diagnosis not present

## 2023-07-23 DIAGNOSIS — K219 Gastro-esophageal reflux disease without esophagitis: Secondary | ICD-10-CM | POA: Diagnosis not present

## 2023-07-23 DIAGNOSIS — J310 Chronic rhinitis: Secondary | ICD-10-CM

## 2023-07-23 MED ORDER — FLUTICASONE PROPIONATE 50 MCG/ACT NA SUSP: 2.0000 | Freq: Every day | NASAL | 1 refills | Status: DC

## 2023-07-23 MED ORDER — FAMOTIDINE 40 MG PO TABS: 40.0000 mg | ORAL_TABLET | Freq: Two times a day (BID) | ORAL | 1 refills | Status: DC

## 2023-07-23 MED ORDER — CETIRIZINE HCL 10 MG PO TABS: 10.0000 mg | ORAL_TABLET | Freq: Every day | ORAL | 1 refills | Status: DC | PRN

## 2023-07-23 MED ORDER — MONTELUKAST SODIUM 10 MG PO TABS: 10.0000 mg | ORAL_TABLET | Freq: Every evening | ORAL | 1 refills | Status: DC

## 2023-07-23 NOTE — Patient Instructions (Addendum)
  1. Continue to Treat reflux / LPR:   A. Famotidine 40 mg -1 tablet 2 times per day  2. Continue to treat allergies:   A. Montelukast 10 mg - 1 tablet 1 time per day  B. Cetirizine 10 mg - 1 tablet 1 time per day if needed  C. Flonase - 1 spray each nostril 3-7 times per week  3.  For this recent event:   A. Nasal saline a few times per day  B. Prednisone 20 mg delivered in clinic today  4. Return to clinic in 6 months or earlier if problem  5. Influenza = Tamiflu. Covid = Paxlovid

## 2023-07-23 NOTE — Progress Notes (Unsigned)
 [place] - [place] - [place] - [place] - [place]   Follow-up Note  Referring Provider: Hillery Aldo, NP Primary Provider: Hillery Aldo, NP Date of Office Visit: 07/23/2023  Subjective:   Jessica [name] (DOB: 03/09/1956) is a 68 y.o. female who [facility] on 07/23/2023 in re-evaluation of the following:  HPI: Arvis returns to this clinic in evaluation of LPR / choking, rhinitis. I last saw her in this clinic 22 January 2023.  She believes that she has her LPR under very good control while using famotidine two times per day and she has no choking.  The past week she and her husband both develop some nasal congestion and post nasal drip. She uses montelukast daily and cetirizine intermittently and was doing well until this event.    Allergies as of 07/23/2023       Reactions   Amoxicillin-pot Clavulanate Diarrhea   Cramps, Abd cramping and diarrhea   Beta Adrenergic Blockers Nausea And Vomiting, Other (See Comments)   Eye infection (only with eye drops), shortness of breath Certain eye gtts that have beta blocking   Dorzolamide Hcl-timolol Mal Other (See Comments)   Burning sensation   Flagyl [metronidazole] Other (See Comments)   C-Diff   Gluten Meal Other (See Comments)   Rectal itching, intestinal inflammation   Motrin [ibuprofen] Swelling   Tongue swells   Oxycodone-acetaminophen Other (See Comments)   hallucinations   Oxycodone-aspirin Other (See Comments)   hallucinations   Sudafed [pseudoephedrine Hcl] Swelling   swollen tongue   Sulfa Antibiotics Swelling   Tongue swells   Ultram [tramadol] Other (See Comments)   hallucinations   Contrast Media [iodinated Contrast Media] Other (See Comments)   Only use non-ionic solutions per md   Iodine Other (See Comments)   Pt unsure but cannot tolerate non-ionic solutions   Macrobid [nitrofurantoin Monohyd Macro] Itching   Sugar-protein-starch Rash, Other (See Comments)   Foggy  thoughts, blood sugar fluctuates excessively   Tape Itching, Rash   Burning   Wheat Other (See Comments)   Rectal itching and burning inside of body (particularly on right side)   Yeast-derived Drug Products Rash        Medication List    albuterol 108 (90 Base) MCG/ACT inhaler Commonly known as: VENTOLIN HFA Inhale 2 puffs into the lungs every 4 (four) hours as needed for wheezing or shortness of breath.   b complex vitamins capsule Take 1 capsule by mouth daily.   cetirizine 10 MG tablet Commonly known as: ZYRTEC Take 1 tablet (10 mg total) by mouth daily as needed for allergies (Can take an extra dose during flare ups.).   cromolyn 100 MG/5ML solution Commonly known as: GASTROCROM Take by mouth.   cyclopentolate 1 % ophthalmic solution Commonly known as: CYCLODRYL,CYCLOGYL SMARTSIG:In Eye(s)   famotidine 40 MG tablet Commonly known as: PEPCID Take 1 tablet (40 mg total) by mouth 2 (two) times daily.   fluticasone 50 MCG/ACT nasal spray Commonly known as: FLONASE Place 2 sprays into both nostrils daily.   Magnesium Gluconate 500 (27 Mg) MG Tabs Take 500 mg by mouth daily.   montelukast 10 MG tablet Commonly known as: SINGULAIR Take 1 tablet (10 mg total) by mouth at bedtime. TAKE 1 TABLET(10 MG) BY MOUTH AT BEDTIME   NON FORMULARY Take 1 Scoop by mouth 2 (two) times daily.   Omega-3 1000 MG Caps Take 1 capsule by mouth daily.   SYSTANE FREE OP Apply to eye.  Vitamin D3 125 MCG (5000 UT) Tabs Take 5,000 Units by mouth daily.    Past Medical History:  Diagnosis Date   Glaucoma    Hypoglycemia    Optic neuropathy     Past Surgical History:  Procedure Laterality Date   ABDOMINAL HYSTERECTOMY     CESAREAN SECTION     EYE SURGERY     EYE SURGERY  10/11/2019   SMALL INTESTINE SURGERY  2002   TONSILLECTOMY     TUBAL LIGATION      Review of systems negative except as noted in HPI / PMHx or noted below:  Review of Systems  Constitutional:  Negative.   HENT: Negative.    Eyes: Negative.   Respiratory: Negative.    Cardiovascular: Negative.   Gastrointestinal: Negative.   Genitourinary: Negative.   Musculoskeletal: Negative.   Skin: Negative.   Neurological: Negative.   Endo/Heme/Allergies: Negative.   Psychiatric/Behavioral: Negative.       Objective:   Vitals:   07/23/23 1133 07/23/23 1158  BP: 102/64 110/64  Pulse: 67   Resp: 16   Temp: 98 F (36.7 C)   SpO2: 98%    Height: 5\' 3"  (160 cm)  Weight: 113 lb 12.8 oz (51.6 kg)   Physical Exam Constitutional:      Appearance: She is not diaphoretic.  HENT:     Head: Normocephalic.     Right Ear: Tympanic membrane, ear canal and external ear normal.     Left Ear: Ear canal and external ear normal. A middle ear effusion is present.     Nose: Nose normal. No mucosal edema or rhinorrhea.     Mouth/Throat:     Pharynx: Uvula midline. No oropharyngeal exudate.  Eyes:     Conjunctiva/sclera: Conjunctivae normal.  Neck:     Thyroid: No thyromegaly.     Trachea: Trachea normal. No tracheal tenderness or tracheal deviation.  Cardiovascular:     Rate and Rhythm: Normal rate and regular rhythm.     Heart sounds: Normal heart sounds, S1 normal and S2 normal. No murmur heard. Pulmonary:     Effort: No respiratory distress.     Breath sounds: Normal breath sounds. No stridor. No wheezing or rales.  Lymphadenopathy:     Head:     Right side of head: No tonsillar adenopathy.     Left side of head: No tonsillar adenopathy.     Cervical: No cervical adenopathy.  Skin:    Findings: No erythema or rash.     Nails: There is no clubbing.  Neurological:     Mental Status: She is alert.     Diagnostics: none  Assessment and Plan:   1. LPRD (laryngopharyngeal reflux disease)   2. Nonallergic rhinitis   3. Viral respiratory illness   4. Dysfunction of left eustachian tube    1. Continue to Treat reflux / LPR:   A. Famotidine 40 mg -1 tablet 2 times per  day  2. Continue to treat allergies:   A. Montelukast 10 mg - 1 tablet 1 time per day  B. Cetirizine 10 mg - 1 tablet 1 time per day if needed  C. Flonase - 1 spray each nostril 3-7 times per week  3.  For this recent event:   A. Nasal saline a few times per day  B. Prednisone 20 mg delivered in clinic today  4. Return to clinic in 6 months or earlier if problem  5. Influenza = Tamiflu. Covid = Paxlovid  Tersa appears  to have a viral respiratory tract infection that should respond to conservative treatment as noted above. Assuming she does well and resolves this issue she can continue to treat her LPR and rhinitis with the tretment noted above and we will see her back in this clinic in 6 months or earlier if problem.   Laurette Schimke, MD Allergy / Immunology [facility]

## 2023-07-24 ENCOUNTER — Encounter: Payer: Self-pay | Admitting: Allergy and Immunology

## 2023-08-01 ENCOUNTER — Telehealth: Payer: Self-pay | Admitting: Allergy and Immunology

## 2023-08-01 NOTE — Telephone Encounter (Signed)
 Error

## 2023-08-16 ENCOUNTER — Other Ambulatory Visit: Payer: Self-pay | Admitting: Student

## 2023-08-16 DIAGNOSIS — Z1231 Encounter for screening mammogram for malignant neoplasm of breast: Secondary | ICD-10-CM

## 2023-09-01 ENCOUNTER — Other Ambulatory Visit: Payer: Self-pay | Admitting: Allergy and Immunology

## 2024-01-21 ENCOUNTER — Ambulatory Visit: Admitting: Allergy and Immunology

## 2024-02-04 ENCOUNTER — Ambulatory Visit (INDEPENDENT_AMBULATORY_CARE_PROVIDER_SITE_OTHER): Admitting: Allergy and Immunology

## 2024-02-04 ENCOUNTER — Other Ambulatory Visit: Payer: Self-pay

## 2024-02-04 ENCOUNTER — Encounter: Payer: Self-pay | Admitting: Allergy and Immunology

## 2024-02-04 VITALS — BP 100/60 | HR 60 | Temp 98.0°F | Resp 18 | Ht 63.0 in | Wt 118.6 lb

## 2024-02-04 DIAGNOSIS — K219 Gastro-esophageal reflux disease without esophagitis: Secondary | ICD-10-CM

## 2024-02-04 DIAGNOSIS — J310 Chronic rhinitis: Secondary | ICD-10-CM

## 2024-02-04 MED ORDER — CETIRIZINE HCL 10 MG PO TABS: 10.0000 mg | ORAL_TABLET | Freq: Every day | ORAL | 3 refills | Status: AC | PRN

## 2024-02-04 MED ORDER — FAMOTIDINE 40 MG PO TABS: 40.0000 mg | ORAL_TABLET | Freq: Two times a day (BID) | ORAL | 3 refills | Status: AC

## 2024-02-04 MED ORDER — MONTELUKAST SODIUM 10 MG PO TABS: 10.0000 mg | ORAL_TABLET | Freq: Every evening | ORAL | 3 refills | Status: AC

## 2024-02-04 MED ORDER — FLUTICASONE PROPIONATE 50 MCG/ACT NA SUSP: 2.0000 | Freq: Every day | NASAL | 3 refills | Status: AC

## 2024-02-04 NOTE — Patient Instructions (Addendum)
  1. Continue to Treat reflux / LPR:   A. Famotidine  40 mg -1 tablet 2 times per day  2. Continue to treat allergies:   A. Montelukast  10 mg - 1 tablet 1 time per day  B. Cetirizine  10 mg - 1 tablet 1 time per day   C. Flonase  - 1 spray each nostril 1 time per day  3.  Return to clinic in 12 months or earlier if problem  4. Influenza = Tamiflu. Covid = Paxlovid

## 2024-02-04 NOTE — Progress Notes (Unsigned)
 [facility] [facility] [place] [facility] [place] [facility] [place] - [place]   Follow-up Note  Referring Provider: Campbell Reynolds, NP Primary Provider: Campbell Reynolds, NP Date of Office Visit: 02/04/2024  Subjective:   Jessica [name] (DOB: [birth year]) is a 68 y.o. female who [facility] in re-evaluation of the following:  HPI: [name] returns to this clinic in evaluation of LPR and rhinitis.  I last saw her in this clinic 23 July 2023.  Now that she has her LPR under pretty good control she has had no choking episodes and overall her throat feels really good.  She is using famotidine  twice a day.  She has had very little problems with her upper airways while using montelukast  and cetirizine  and Flonase .  She and her husband did contract a viral respiratory tract infection a few weeks ago and he had to go to see his [place] doctor to receive some medicines but fortunately Coila did not get particularly sick although she does have a little bit more drainage from her throat since this event has occurred.  She never developed any ugly nasal discharge or fever or anosmia or other associated systemic or constitutional symptoms.  Allergies as of 02/04/2024       Reactions   Amoxicillin -pot Clavulanate Diarrhea   Cramps, Abd cramping and diarrhea   Beta Adrenergic Blockers Nausea And Vomiting, Other (See Comments)   Eye infection (only with eye drops), shortness of breath Certain eye gtts that have beta blocking   Dorzolamide Hcl-timolol Mal Other (See Comments)   Burning sensation   Flagyl [metronidazole] Other (See Comments)   C-Diff   Gluten Meal Other (See Comments)   Rectal itching, intestinal inflammation   Motrin [ibuprofen] Swelling   Tongue swells   Oxycodone-acetaminophen  Other (See Comments)   hallucinations   Oxycodone-aspirin Other (See Comments)   hallucinations   Sudafed [pseudoephedrine Hcl] Swelling   swollen tongue   Sulfa Antibiotics Swelling    Tongue swells   Ultram [tramadol] Other (See Comments)   hallucinations   Contrast Media [iodinated Contrast Media] Other (See Comments)   Only use non-ionic solutions per md   Iodine Other (See Comments)   Pt unsure but cannot tolerate non-ionic solutions   Macrobid [nitrofurantoin Monohyd Macro] Itching   Sugar-protein-starch Rash, Other (See Comments)   Foggy thoughts, blood sugar fluctuates excessively   Tape Itching, Rash   Burning   Wheat Other (See Comments)   Rectal itching and burning inside of body (particularly on right side)   Yeast-derived Drug Products Rash        Medication List    albuterol  108 (90 Base) MCG/ACT inhaler Commonly known as: VENTOLIN  HFA Inhale 2 puffs into the lungs every 4 (four) hours as needed for wheezing or shortness of breath.   b complex vitamins capsule Take 1 capsule by mouth daily.   cetirizine  10 MG tablet Commonly known as: ZYRTEC  Take 1 tablet (10 mg total) by mouth daily as needed for allergies (Can take an extra dose during flare ups.).   cromolyn 100 MG/5ML solution Commonly known as: GASTROCROM Take by mouth.   cyclopentolate 1 % ophthalmic solution Commonly known as: CYCLODRYL,CYCLOGYL SMARTSIG:In Eye(s)   famotidine  40 MG tablet Commonly known as: PEPCID  TAKE 1 TABLET(40 MG) BY MOUTH TWICE DAILY   fluticasone  50 MCG/ACT nasal spray Commonly known as: FLONASE  Place 2 sprays into both nostrils daily.   magnesium gluconate 500 (27 Mg) MG Tabs tablet Commonly known as: MAGONATE  Take 500 mg by mouth daily.   montelukast  10 MG tablet Commonly known as: SINGULAIR  TAKE 1 TABLET(10 MG) BY MOUTH AT BEDTIME   NON FORMULARY Take 1 Scoop by mouth 2 (two) times daily.   Omega-3 1000 MG Caps Take 1 capsule by mouth daily.   SYSTANE FREE OP Apply to eye.   Vitamin D3 125 MCG (5000 UT) Tabs Take 5,000 Units by mouth daily.    Past Medical History:  Diagnosis Date   Glaucoma    Hypoglycemia    Optic neuropathy      Past Surgical History:  Procedure Laterality Date   ABDOMINAL HYSTERECTOMY     CESAREAN SECTION     EYE SURGERY     EYE SURGERY  10/11/2019   SMALL INTESTINE SURGERY  2002   TONSILLECTOMY     TUBAL LIGATION      Review of systems negative except as noted in HPI / PMHx or noted below:  Review of Systems  Constitutional: Negative.   HENT: Negative.    Eyes: Negative.   Respiratory: Negative.    Cardiovascular: Negative.   Gastrointestinal: Negative.   Genitourinary: Negative.   Musculoskeletal: Negative.   Skin: Negative.   Neurological: Negative.   Endo/Heme/Allergies: Negative.   Psychiatric/Behavioral: Negative.       Objective:   Vitals:   02/04/24 1109  BP: 100/60  Pulse: 60  Resp: 18  Temp: 98 F (36.7 C)  SpO2: 98%   Height: 5' 3 (160 cm)  Weight: 118 lb 9.6 oz (53.8 kg)   Physical Exam Constitutional:      Appearance: She is not diaphoretic.  HENT:     Head: Normocephalic.     Right Ear: Tympanic membrane, ear canal and external ear normal.     Left Ear: Tympanic membrane, ear canal and external ear normal.     Nose: Nose normal. No mucosal edema or rhinorrhea.     Mouth/Throat:     Pharynx: Uvula midline. No oropharyngeal exudate.  Eyes:     Conjunctiva/sclera: Conjunctivae normal.  Neck:     Thyroid : No thyromegaly.     Trachea: Trachea normal. No tracheal tenderness or tracheal deviation.  Cardiovascular:     Rate and Rhythm: Normal rate and regular rhythm.     Heart sounds: Normal heart sounds, S1 normal and S2 normal. No murmur heard. Pulmonary:     Effort: No respiratory distress.     Breath sounds: Normal breath sounds. No stridor. No wheezing or rales.  Lymphadenopathy:     Head:     Right side of head: No tonsillar adenopathy.     Left side of head: No tonsillar adenopathy.     Cervical: No cervical adenopathy.  Skin:    Findings: No erythema or rash.     Nails: There is no clubbing.  Neurological:     Mental Status: She  is alert.     Diagnostics: none  Assessment and Plan:   1. LPRD (laryngopharyngeal reflux disease)   2. Nonallergic rhinitis    1. Continue to Treat reflux / LPR:   A. Famotidine  40 mg -1 tablet 2 times per day  2. Continue to treat allergies:   A. Montelukast  10 mg - 1 tablet 1 time per day  B. Cetirizine  10 mg - 1 tablet 1 time per day   C. Flonase  - 1 spray each nostril 1 time per day  3.  Return to clinic in 12 months or earlier if problem  4. Influenza =  Tamiflu. Covid = Paxlovid  Brooke appears to be doing pretty well on her current plan of addressing LPR with famotidine  twice a day and addressing airway inflammation with a leukotriene modifier nasal steroid.  Assuming she continues to do well with this plan I will see her back in this clinic in 1 year or earlier if there is a problem.  Camellia Denis, MD Allergy / Immunology [facility]

## 2024-02-05 ENCOUNTER — Encounter: Payer: Self-pay | Admitting: Allergy and Immunology

## 2025-02-02 ENCOUNTER — Ambulatory Visit: Admitting: Allergy and Immunology
